# Patient Record
Sex: Female | Born: 1970 | Race: Black or African American | Hispanic: No | Marital: Single | Smoking: Never smoker
Health system: Southern US, Community
[De-identification: ages and names within clinical notes are randomized; demographics above are authoritative.]

## PROBLEM LIST (undated history)

## (undated) DIAGNOSIS — E7800 Pure hypercholesterolemia, unspecified: Secondary | ICD-10-CM

## (undated) DIAGNOSIS — I10 Essential (primary) hypertension: Secondary | ICD-10-CM

## (undated) HISTORY — PX: OTHER SURGICAL HISTORY: SHX169

## (undated) HISTORY — PX: ABDOMINAL HYSTERECTOMY: SHX81

---

## 2010-06-21 ENCOUNTER — Inpatient Hospital Stay (HOSPITAL_COMMUNITY): Admission: RE | Admit: 2010-06-21 | Discharge: 2010-06-23 | Payer: Self-pay | Admitting: Obstetrics

## 2010-06-21 ENCOUNTER — Encounter (INDEPENDENT_AMBULATORY_CARE_PROVIDER_SITE_OTHER): Payer: Self-pay | Admitting: Obstetrics

## 2011-03-16 LAB — BASIC METABOLIC PANEL
BUN: 15 mg/dL (ref 6–23)
CO2: 26 mEq/L (ref 19–32)
Calcium: 9.8 mg/dL (ref 8.4–10.5)
Creatinine, Ser: 0.76 mg/dL (ref 0.4–1.2)
GFR calc Af Amer: >60 mL/min (ref >60)

## 2011-03-16 LAB — ABO/RH: ABO/RH(D): O POS

## 2011-03-16 LAB — CBC
HCT: 36.9 % (ref 36.0–46.0)
MCH: 31.3 pg (ref 26.0–34.0)
MCHC: 33.9 g/dL (ref 30.0–36.0)
Platelets: 185 10*3/uL (ref 150–400)
Platelets: 237 10*3/uL (ref 150–400)
RBC: 4.81 MIL/uL (ref 3.87–5.11)
RDW: 13 % (ref 11.5–15.5)
RDW: 13.5 % (ref 11.5–15.5)
WBC: 10.4 10*3/uL (ref 4.0–10.5)

## 2011-03-16 LAB — SURGICAL PCR SCREEN: MRSA, PCR: NEGATIVE

## 2011-03-16 LAB — TYPE AND SCREEN

## 2011-03-16 LAB — PREGNANCY, URINE: Preg Test, Ur: NEGATIVE

## 2013-02-23 ENCOUNTER — Telehealth: Payer: Self-pay | Admitting: Radiology

## 2013-02-23 ENCOUNTER — Ambulatory Visit (INDEPENDENT_AMBULATORY_CARE_PROVIDER_SITE_OTHER): Payer: Managed Care, Other (non HMO) | Admitting: Family Medicine

## 2013-02-23 ENCOUNTER — Emergency Department (HOSPITAL_COMMUNITY)
Admission: RE | Admit: 2013-02-23 | Discharge: 2013-02-23 | Disposition: A | Discharge status: Home / Self Care | Payer: Managed Care, Other (non HMO) | Source: Ambulatory Visit | Attending: Family Medicine | Admitting: Family Medicine

## 2013-02-23 ENCOUNTER — Emergency Department (HOSPITAL_COMMUNITY): Admission: EM | Admit: 2013-02-23 | Payer: Managed Care, Other (non HMO) | Source: Home / Self Care

## 2013-02-23 DIAGNOSIS — L0201 Cutaneous abscess of face: Secondary | ICD-10-CM | POA: Insufficient documentation

## 2013-02-23 DIAGNOSIS — L03211 Cellulitis of face: Secondary | ICD-10-CM | POA: Insufficient documentation

## 2013-02-23 DIAGNOSIS — H00039 Abscess of eyelid unspecified eye, unspecified eyelid: Secondary | ICD-10-CM

## 2013-02-23 LAB — POCT CBC
HCT, POC: 40.6 % (ref 37.7–47.9)
Hemoglobin: 13.2 g/dL (ref 12.2–16.2)
Lymph, poc: 2.3 (ref 0.6–3.4)
MCH, POC: 29.9 pg (ref 27–31.2)
MCHC: 32.5 g/dL (ref 31.8–35.4)
MCV: 91.8 fL (ref 80–97)
POC Granulocyte: 3.4 (ref 2–6.9)
POC LYMPH PERCENT: 37.9 %L (ref 10–50)
RDW, POC: 13.1 %
WBC: 6.1 10*3/uL (ref 4.6–10.2)

## 2013-02-23 MED ORDER — TRAMADOL HCL 50 MG PO TABS: 50.0000 mg | ORAL_TABLET | Freq: Three times a day (TID) | ORAL | Status: DC | PRN

## 2013-02-23 MED ORDER — CEFDINIR 300 MG PO CAPS: 300.0000 mg | ORAL_CAPSULE | Freq: Two times a day (BID) | ORAL | Status: DC

## 2013-02-23 MED ORDER — IOHEXOL 300 MG/ML  SOLN
100.0000 mL | Freq: Once | INTRAMUSCULAR | Status: AC | PRN
  Administered 2013-02-23: 100 mL via INTRAVENOUS

## 2013-02-23 NOTE — Telephone Encounter (Signed)
Patients Ct scan RS to tomorrow. Please advise on pain meds for patient.

## 2013-02-23 NOTE — Progress Notes (Addendum)
[facility] [address], [place] [address] [phone]  Date:  02/23/2013   Name:  Sara Salinas   DOB:  1971-06-05   MRN:  [id number]  PCP:  No primary provider on file.    Chief Complaint: Sinus Problem and Blurred Vision   History of Present Illness:  Sara Salinas is a 42 y.o. very pleasant female patient who presents with the following:  Today is Wednesday.  Late Monday night she noted pain in her sinuses- yesterday she noted that the area around her right eye was swollen and tender. Her right eye vision is slightly blurry. She does not have any FB sensation or sensation that her cornea is affected. Her face continues to be swollen and tender today, and this seems different from a typical attack of sinusitis to her.   There is no known trauma No fever or chills No cough No GI symptoms No ST The right eye has been watering- just clear fluid running from her eye, no matting or crusting No photophobia.   No corrective lenses used.  She is generally healthy Hysterectomy- no change of pregnancy   Of note her visual acuity was actually better in her right eye on testing today.  There is no problem list on file for this patient.   History reviewed. No pertinent past medical history.  Past Surgical History  Procedure Laterality Date  . Abdominal hysterectomy      History  Substance Use Topics  . Smoking status: Never Smoker   . Smokeless tobacco: Not on file  . Alcohol Use: Yes    Family History  Problem Relation Age of Onset  . Hyperlipidemia Mother   . Heart disease Maternal Grandmother   . Diabetes Maternal Grandmother   . Hypertension Maternal Grandmother     No Known Allergies  Medication list has been reviewed and updated.  No current outpatient prescriptions on file prior to visit.   No current facility-administered medications on file prior to visit.    Review of Systems:  As per HPI- otherwise  negative.   Physical Examination: Filed Vitals:   02/23/13 1419  BP: 123/83  Pulse: 76  Temp: 97.9 F (36.6 C)  Resp: 18   Filed Vitals:   02/23/13 1419  Height: 5\' 4"  (1.626 m)  Weight: 159 lb (72.122 kg)   Body mass index is 27.28 kg/(m^2). Ideal Body Weight: Weight in (lb) to have BMI = 25: 145.3  GEN: WDWN, NAD, Non-toxic, A & O x 3 HEENT: Atraumatic, Normocephalic. Neck supple. No masses, No LAD.  Bilateral TM wnl, oropharynx normal.  PEERL,EOMI.  Nasal cavity normal She has slight edema and darkening/ redness under the right eye. This area is tender to the touch. She has minimal discomfort with use of extraorbial muscles Negative fluroescin stain Normal fundoscopic exam, no conjunctival injection, no pain with gentle pressure on globe Ears and Nose: No external deformity. CV: RRR, No M/G/R. No JVD. No thrill. No extra heart sounds. PULM: CTA B, no wheezes, crackles, rhonchi. No retractions. No resp. distress. No accessory muscle use. ABD: S, NT, ND EXTR: No c/c/e NEURO Normal gait.  PSYCH: Normally interactive. Conversant. Not depressed or anxious appearing.  Calm demeanor.   Results for orders placed in visit on 02/23/13  POCT CBC      Result Value Range   WBC 6.1  4.6 - 10.2 K/uL   Lymph, poc 2.3  0.6 - 3.4   POC LYMPH PERCENT  37.9  10 - 50 %L   MID (cbc) 0.3  0 - 0.9   POC MID % 5.7  0 - 12 %M   POC Granulocyte 3.4  2 - 6.9   Granulocyte percent 56.4  37 - 80 %G   RBC 4.42  4.04 - 5.48 M/uL   Hemoglobin 13.2  12.2 - 16.2 g/dL   HCT, POC 14.7  82.9 - 47.9 %   MCV 91.8  80 - 97 fL   MCH, POC 29.9  27 - 31.2 pg   MCHC 32.5  31.8 - 35.4 g/dL   RDW, POC 56.2     Platelet Count, POC 270  142 - 424 K/uL   MPV 9.7  0 - 99.8 fL    Assessment and Plan: Periorbital cellulitis, right - Plan: POCT CBC, Culture, blood (single), CT Maxillofacial W/Cm, CANCELED: CT Maxillofacial W/Cm  Concern for possible peri- orbital vs orbital cellulitis.  Will send for a CT now  to further evaluate and ensure she does not have orbital cellulitis  Tiki Tucciarone, MD  CT MAXILLOFACIAL WITH CONTRAST  Technique: Multidetector CT imaging of the maxillofacial structures was performed with intravenous contrast. Multiplanar CT image reconstructions were also generated.  Contrast: 80 ml Omnipaque 300 IV.  Comparison: None.  Findings: Slight preorbital soft tissue swelling bilaterally, right slightly greater than left. No focal fluid collections to suggest abscess. Globes and intraorbital soft tissues are unremarkable.  Rounded soft tissue in the maxillary sinuses, likely mucous retention cysts or polyps. No air fluid levels in the paranasal sinuses. Mastoids are clear.  IMPRESSION: Slight preorbital soft tissue swelling. No focal fluid collection to suggest abscess.  Discussed with Dr.Dover- no sign of orbital cellulitis.  Question mild peri- orbital cellulitis.  Will treat her with omnicef and ultram prn for her pain.  Will send these to her drug store.  Called and discussed with her.  Will also have an OOW note written for her for the rest of the week.  She will let me know if not better in the next one or two days

## 2013-02-23 NOTE — Telephone Encounter (Signed)
[id number] case # is not approved with [employer] [id number] is CPT code for the study. They did speak to DR Copland auth good for 45days [id number]

## 2013-02-23 NOTE — Patient Instructions (Addendum)
You can go now for your CT scan auth # from [employer] is  [id number]  Once your CT is done we will talk on the phone and decide the next step- you will most likely need antibiotics

## 2013-02-23 NOTE — Telephone Encounter (Signed)
CT scan can be done today at [facility] hospital now, maxillofacial. Patient advised.

## 2013-02-24 ENCOUNTER — Telehealth: Payer: Self-pay | Admitting: Family Medicine

## 2013-02-24 ENCOUNTER — Other Ambulatory Visit: Payer: Self-pay | Admitting: Family Medicine

## 2013-02-24 NOTE — Telephone Encounter (Signed)
Called and LMOM- wanted to check on her.  If she feels that she is at all worse please go back to [facility] for a recheck.  Otherwise can look at her tomorrow to ensure she is improving.  Will try her again later

## 2013-02-24 NOTE — Telephone Encounter (Signed)
Message copied by Pearline Cables on Thu Feb 24, 2013  4:07 PM ------      Message from: Abbe Amsterdam C      Created: Wed Feb 23, 2013  9:47 PM       Work note, check on her ------

## 2013-02-24 NOTE — Telephone Encounter (Signed)
Message copied by Pearline Cables on Thu Feb 24, 2013  1:39 PM ------      Message from: Abbe Amsterdam C      Created: Wed Feb 23, 2013  9:47 PM       Work note, check on her ------

## 2013-02-24 NOTE — Telephone Encounter (Signed)
Spoke with her- she feels a bit better, certainly not worse.  Advised her that the most conservative thing would be to check her back in clinic today to gauge her progress.  However, as she just started her medication this morning she is not sure if this would be helpful.  If she prefers, she may come by clinic to see me tomorrow and I will be glad to recheck her to ensure she is improving.  Gave her my hours for tomorrow and advised her to call or RTC if she starts getting worse or has any other concerns

## 2013-02-24 NOTE — Telephone Encounter (Signed)
Message copied by Pearline Cables on Thu Feb 24, 2013  2:04 PM ------      Message from: Abbe Amsterdam C      Created: Wed Feb 23, 2013  9:47 PM       Work note, check on her ------

## 2013-02-25 ENCOUNTER — Telehealth: Payer: Self-pay | Admitting: Family Medicine

## 2013-02-25 ENCOUNTER — Ambulatory Visit: Payer: Managed Care, Other (non HMO)

## 2013-02-25 NOTE — Telephone Encounter (Signed)
Dr Patsy Lager wanted to see how patient was she was suppose to follow up with her today(02/25/13) but didn't [facility] for patient to give [place] a call to see how she is doing

## 2013-02-27 ENCOUNTER — Telehealth: Payer: Self-pay | Admitting: Family Medicine

## 2013-02-27 NOTE — Telephone Encounter (Signed)
Called and LMOM- please let us know if she is not better, may come in or call me if needed.  Otherwise I will assume that she is now doing doing better

## 2013-03-01 ENCOUNTER — Encounter: Payer: Self-pay | Admitting: Family Medicine

## 2013-06-01 ENCOUNTER — Emergency Department (HOSPITAL_COMMUNITY): Payer: Managed Care, Other (non HMO)

## 2013-06-01 ENCOUNTER — Encounter (HOSPITAL_COMMUNITY): Payer: Self-pay | Admitting: *Deleted

## 2013-06-01 ENCOUNTER — Emergency Department (HOSPITAL_COMMUNITY)
Admission: EM | Admit: 2013-06-01 | Discharge: 2013-06-01 | Disposition: A | Discharge status: Home / Self Care | Payer: Managed Care, Other (non HMO) | Attending: Emergency Medicine | Admitting: Emergency Medicine

## 2013-06-01 DIAGNOSIS — N83209 Unspecified ovarian cyst, unspecified side: Secondary | ICD-10-CM

## 2013-06-01 LAB — CBC WITH DIFFERENTIAL/PLATELET
Basophils Absolute: 0 10*3/uL (ref 0.0–0.1)
Basophils Relative: 0 % (ref 0–1)
Eosinophils Absolute: 0 10*3/uL (ref 0.0–0.7)
Eosinophils Relative: 0 % (ref 0–5)
HCT: 39.4 % (ref 36.0–46.0)
Hemoglobin: 14.5 g/dL (ref 12.0–15.0)
Lymphocytes Relative: 15 % (ref 12–46)
Lymphs Abs: 1.2 10*3/uL (ref 0.7–4.0)
MCH: 31.2 pg (ref 26.0–34.0)
MCHC: 36.8 g/dL — ABNORMAL HIGH (ref 30.0–36.0)
MCV: 84.7 fL (ref 78.0–100.0)
Monocytes Absolute: 0.3 10*3/uL (ref 0.1–1.0)
Monocytes Relative: 3 % (ref 3–12)
Neutro Abs: 7 10*3/uL (ref 1.7–7.7)
Neutrophils Relative %: 82 % — ABNORMAL HIGH (ref 43–77)
Platelets: 238 10*3/uL (ref 150–400)
RBC: 4.65 MIL/uL (ref 3.87–5.11)
RDW: 11.9 % (ref 11.5–15.5)
WBC: 8.5 10*3/uL (ref 4.0–10.5)

## 2013-06-01 LAB — COMPREHENSIVE METABOLIC PANEL
ALT: 13 U/L (ref 0–35)
AST: 17 U/L (ref 0–37)
Albumin: 3.9 g/dL (ref 3.5–5.2)
Alkaline Phosphatase: 66 U/L (ref 39–117)
BUN: 11 mg/dL (ref 6–23)
CO2: 24 mEq/L (ref 19–32)
Calcium: 9.8 mg/dL (ref 8.4–10.5)
Chloride: 98 mEq/L (ref 96–112)
Creatinine, Ser: 0.66 mg/dL (ref 0.50–1.10)
GFR calc Af Amer: >90 mL/min (ref >90)
GFR calc non Af Amer: >90 mL/min (ref >90)
Glucose, Bld: 119 mg/dL — ABNORMAL HIGH (ref 70–99)
Potassium: 3.8 mEq/L (ref 3.5–5.1)
Sodium: 133 mEq/L — ABNORMAL LOW (ref 135–145)
Total Bilirubin: 0.8 mg/dL (ref 0.3–1.2)
Total Protein: 8 g/dL (ref 6.0–8.3)

## 2013-06-01 LAB — URINALYSIS, ROUTINE W REFLEX MICROSCOPIC
Bilirubin Urine: NEGATIVE
Glucose, UA: NEGATIVE mg/dL
Hgb urine dipstick: NEGATIVE
Ketones, ur: >80 mg/dL — AB
Leukocytes, UA: NEGATIVE
Nitrite: NEGATIVE
Protein, ur: NEGATIVE mg/dL
Specific Gravity, Urine: 1.027 (ref 1.005–1.030)
Urobilinogen, UA: 1 mg/dL (ref 0.0–1.0)
pH: 7 (ref 5.0–8.0)

## 2013-06-01 MED ORDER — KETOROLAC TROMETHAMINE 30 MG/ML IJ SOLN
15.0000 mg | Freq: Once | INTRAMUSCULAR | Status: AC
  Administered 2013-06-01: 15 mg via INTRAVENOUS
  Filled 2013-06-01: qty 1

## 2013-06-01 MED ORDER — SODIUM CHLORIDE 0.9 % IV BOLUS (SEPSIS)
1000.0000 mL | Freq: Once | INTRAVENOUS | Status: AC
  Administered 2013-06-01: 1000 mL via INTRAVENOUS

## 2013-06-01 MED ORDER — HYDROCODONE-ACETAMINOPHEN 5-325 MG PO TABS: 1.0000 | ORAL_TABLET | Freq: Four times a day (QID) | ORAL | Status: DC | PRN

## 2013-06-01 MED ORDER — ONDANSETRON 8 MG PO TBDP
8.0000 mg | ORAL_TABLET | Freq: Once | ORAL | Status: AC
  Administered 2013-06-01: 8 mg via ORAL
  Filled 2013-06-01: qty 1

## 2013-06-01 MED ORDER — IOHEXOL 300 MG/ML  SOLN: 50.0000 mL | Freq: Once | INTRAMUSCULAR | Status: DC | PRN

## 2013-06-01 MED ORDER — ONDANSETRON HCL 4 MG/2ML IJ SOLN
4.0000 mg | Freq: Once | INTRAMUSCULAR | Status: AC
  Administered 2013-06-01: 4 mg via INTRAVENOUS
  Filled 2013-06-01: qty 2

## 2013-06-01 MED ORDER — HYDROMORPHONE HCL PF 1 MG/ML IJ SOLN
1.0000 mg | Freq: Once | INTRAMUSCULAR | Status: AC
  Administered 2013-06-01: 1 mg via INTRAVENOUS
  Filled 2013-06-01: qty 1

## 2013-06-01 NOTE — ED Notes (Signed)
Pt alert x4 v/s d/c instructions given teach back teaching done. Will monitor.

## 2013-06-01 NOTE — ED Notes (Signed)
Pt in c/o LLQ abd pain since 6pm, states pain came on suddenly, worsened over night, also n/v

## 2013-06-01 NOTE — ED Provider Notes (Signed)
History     CSN: [id number]  Arrival date & time 06/01/13  0228   First MD Initiated Contact with Patient 06/01/13 [phone]      Chief Complaint  Patient presents with  . Abdominal Pain    (Consider location/radiation/quality/duration/timing/severity/associated sxs/prior treatment) HPI Comments: Patient states, that 6 PM last night.  She suddenly had pain.  That started in her left side, radiating to left lower quadrant, sharp, and stabbing, and its been waxing and waning in intensity since that time, associated with nausea and vomiting.  No diarrhea, has no previous history of any type of abdominal pain.  She does have a history of a complete abdominal hysterectomy.  Several years ago  Patient is a 42 y.o. female presenting with abdominal pain. The history is provided by the patient.  Abdominal Pain This is a new problem. The current episode started today. The problem occurs constantly. The problem has been waxing and waning. Associated symptoms include abdominal pain, nausea and vomiting. Pertinent negatives include no coughing, fever or rash. She has tried nothing for the symptoms. The treatment provided no relief.    History reviewed. No pertinent past medical history.  Past Surgical History  Procedure Laterality Date  . Abdominal hysterectomy      Family History  Problem Relation Age of Onset  . Hyperlipidemia Mother   . Heart disease Maternal Grandmother   . Diabetes Maternal Grandmother   . Hypertension Maternal Grandmother     History  Substance Use Topics  . Smoking status: Never Smoker   . Smokeless tobacco: Not on file  . Alcohol Use: Yes    OB History   Grav Para Term Preterm Abortions TAB SAB Ect Mult Living                  Review of Systems  Constitutional: Negative for fever.  Respiratory: Negative for cough.   Gastrointestinal: Positive for nausea, vomiting and abdominal pain.  Genitourinary: Negative for dysuria, frequency and hematuria.  Skin:  Negative for rash and wound.  All other systems reviewed and are negative.    Allergies  Review of patient's allergies indicates no known allergies.  Home Medications   Current Outpatient Rx  Name  Route  Sig  Dispense  Refill  . HYDROcodone-acetaminophen (NORCO/VICODIN) 5-325 MG per tablet   Oral   Take 1 tablet by mouth every 6 (six) hours as needed for pain.   12 tablet   0     BP 136/84  Pulse 97  Temp(Src) 98.3 F (36.8 C) (Oral)  Resp 20  SpO2 95%  Physical Exam  Nursing note and vitals reviewed. Constitutional: She appears well-developed and well-nourished.  HENT:  Head: Normocephalic.  Eyes: Pupils are equal, round, and reactive to light.  Neck: Normal range of motion.  Cardiovascular: Normal rate.   Pulmonary/Chest: Effort normal.  Abdominal: Soft. She exhibits no distension. There is no tenderness.  Musculoskeletal: Normal range of motion.  Neurological: She is alert.  Skin: Skin is warm and dry. No rash noted.    ED Course  Procedures (including critical care time)  Labs Reviewed  CBC WITH DIFFERENTIAL - Abnormal; Notable for the following:    MCHC 36.8 (*)    Neutrophils Relative % 82 (*)    All other components within normal limits  COMPREHENSIVE METABOLIC PANEL - Abnormal; Notable for the following:    Sodium 133 (*)    Glucose, Bld 119 (*)    All other components within normal limits  URINALYSIS, ROUTINE W REFLEX MICROSCOPIC   Ct Abdomen Pelvis Wo Contrast  06/01/2013   *RADIOLOGY REPORT*  Clinical Data: Left-sided abdominal pain.  Nausea, vomiting and diarrhea.  CT ABDOMEN AND PELVIS WITHOUT CONTRAST  Technique:  Multidetector CT imaging of the abdomen and pelvis was performed following the standard protocol without intravenous contrast.  Comparison: No priors.  Findings:  Lung Bases: Unremarkable.  Abdomen/Pelvis:  There are no abnormal calcifications within the collecting system of either kidney, along the course of either ureter, or within  the lumen of the urinary bladder to suggest urinary tract calculi at this time.  No hydroureteronephrosis or perinephric stranding to suggest urinary tract obstruction at this time.  The unenhanced appearance of the kidneys is unremarkable bilaterally.  The unenhanced appearance of the liver, gallbladder, pancreas, spleen and bilateral adrenal glands is unremarkable.  There are adnexal lesions bilaterally.  On the right, this measures 3.5 x 2.6 cm and is of low attenuation, while on the left this measures 4.0 x 3.6 cm and is of intermediate attenuation (approximately 25 HU); these are incompletely characterized, but presumably ovarian cysts. Status post hysterectomy.  Trace volume of free fluid the cul-de- sac.  No larger volume of ascites.  No pneumoperitoneum.  No pathologic distension of small bowel.  No definite pathologic lymphadenopathy identified within the abdomen or pelvis on today's noncontrast CT examination.  Musculoskeletal: There are no aggressive appearing lytic or blastic lesions noted in the visualized portions of the skeleton.  IMPRESSION: 1.  No definite acute findings in the abdomen or pelvis to account for the patient's symptoms. 2.  Bilateral adnexal lesions, as above, presumably ovarian cysts. The largest of these measures 4.0 x 3.6 cm in the left ovary.   Original Report Authenticated By: Trudie Reed, M.D.     1. Ovarian cyst       MDM  Exam, is consistent with a kidney stone.  Patient is awaiting CT scan CT scan, shows that patient has a left ovarian cyst         Arman Filter, NP 06/01/13 [id number]

## 2013-06-01 NOTE — ED Notes (Signed)
C/o n/v/d/ x 1 day. Unable to keep food down.

## 2013-06-02 NOTE — ED Provider Notes (Signed)
Medical screening examination/treatment/procedure(s) were performed by non-physician practitioner and as supervising physician I was immediately available for consultation/collaboration.  Keylin Podolsky, MD 06/02/13 0556 

## 2015-03-06 ENCOUNTER — Ambulatory Visit (INDEPENDENT_AMBULATORY_CARE_PROVIDER_SITE_OTHER): Payer: Managed Care, Other (non HMO) | Admitting: Family Medicine

## 2015-03-06 ENCOUNTER — Ambulatory Visit (HOSPITAL_COMMUNITY)
Admission: RE | Admit: 2015-03-06 | Discharge: 2015-03-06 | Disposition: A | Discharge status: Home / Self Care | Payer: Managed Care, Other (non HMO) | Source: Ambulatory Visit | Attending: Family Medicine | Admitting: Family Medicine

## 2015-03-06 ENCOUNTER — Telehealth: Payer: Self-pay | Admitting: *Deleted

## 2015-03-06 VITALS — BP 118/80 | HR 81 | Temp 97.9°F | Resp 18 | Ht 64.5 in | Wt 157.0 lb

## 2015-03-06 DIAGNOSIS — M79609 Pain in unspecified limb: Secondary | ICD-10-CM | POA: Diagnosis not present

## 2015-03-06 DIAGNOSIS — M79669 Pain in unspecified lower leg: Secondary | ICD-10-CM

## 2015-03-06 DIAGNOSIS — M79604 Pain in right leg: Secondary | ICD-10-CM | POA: Insufficient documentation

## 2015-03-06 DIAGNOSIS — M79605 Pain in left leg: Secondary | ICD-10-CM | POA: Insufficient documentation

## 2015-03-06 LAB — POCT CBC
GRANULOCYTE PERCENT: 52.5 % (ref 37–80)
HEMATOCRIT: 43.2 % (ref 37.7–47.9)
HEMOGLOBIN: 14.1 g/dL (ref 12.2–16.2)
Lymph, poc: 3 (ref 0.6–3.4)
MCH: 29.8 pg (ref 27–31.2)
MCHC: 32.7 g/dL (ref 31.8–35.4)
MCV: 91.1 fL (ref 80–97)
MID (CBC): 0.5 (ref 0–0.9)
MPV: 8.4 fL (ref 0–99.8)
PLATELET COUNT, POC: 150 10*3/uL (ref 142–424)
POC Granulocyte: 3.9 (ref 2–6.9)
POC LYMPH PERCENT: 41.1 %L (ref 10–50)
POC MID %: 6.4 %M (ref 0–12)
RBC: 4.75 M/uL (ref 4.04–5.48)
RDW, POC: 12.9 %
WBC: 7.4 10*3/uL (ref 4.6–10.2)

## 2015-03-06 LAB — POCT UA - MICROSCOPIC ONLY
BACTERIA, U MICROSCOPIC: NEGATIVE
CASTS, UR, LPF, POC: NEGATIVE
Crystals, Ur, HPF, POC: NEGATIVE
Yeast, UA: NEGATIVE

## 2015-03-06 LAB — POCT URINALYSIS DIPSTICK
Bilirubin, UA: NEGATIVE
Glucose, UA: NEGATIVE
Ketones, UA: NEGATIVE
Leukocytes, UA: NEGATIVE
Nitrite, UA: NEGATIVE
PROTEIN UA: 30
SPEC GRAV UA: 1.025
UROBILINOGEN UA: 1
pH, UA: 5.5

## 2015-03-06 LAB — D-DIMER, QUANTITATIVE (NOT AT ARMC): D DIMER QUANT: 1.37 ug{FEU}/mL — AB (ref 0.00–0.48)

## 2015-03-06 LAB — CK: Total CK: 145 U/L (ref 7–177)

## 2015-03-06 NOTE — Patient Instructions (Addendum)
If you do not hear from me by about 4:30 regarding your labs please call back [phone] and tell them I was to call you about the labs  If the labs are negative, then begin diclofenac one twice daily  If you develop leg swelling please return. Also return if symptoms change dramatically in any other fashion.  If you're not doing better in 3 or 4 days of the medication please return

## 2015-03-06 NOTE — Telephone Encounter (Signed)
Pt was called and advised that an 4pm [facility] do[facility].  Pt was told where to go and advised if she had any problems to call the office back for further assistance.

## 2015-03-06 NOTE — Telephone Encounter (Signed)
The patient may be reached at [phone]

## 2015-03-06 NOTE — Progress Notes (Signed)
Subjective: 44 year old lady who comes in here today with a four-day history of leg pains. The pain has gotten worse over that period of time. No GI or GU complaints. Her legs just are aching. She is sore in both legs. It seems symmetrical. It has been hurting all the way down from the thighs to the lower legs, with generalized tenderness of the thighs and calves. She has a little discomfort in the low abdomen. No fevers. No change in activity. She stands and walks a lot at work, but nothing is really change there. It even hurt in the nighttime.  Objective: Healthy-appearing young lady in no major distress. Abdomen is soft without masses. Mild suprapubic tenderness. Good femoral pulses. Straight leg raising test negative though is a little painful bilaterally at 90. Deep tendon reflexes symmetrical. No gross abnormalities of the legs. No bruisings. No swelling. Is not point tender in a given spot, but has tenderness of both the thighs and the calves when squeezed. The calves may be a little more tender medially than laterally, but are tender all over. Thighs do not have any significant medial thigh tenderness.  Assessment: Bilateral leg pain, etiology undetermined Thigh and calf tenderness  Plan: UA, CBC, sedimentation rate, CPK, stat d-dimer  Venous thrombosis seems entirely unlikely, but if the d-dimer is elevated then we'll need to do a ultrasound of her legs  Results for orders placed or performed in visit on 03/06/15  POCT UA - Microscopic Only  Result Value Ref Range   WBC, Ur, HPF, POC 0-4    RBC, urine, microscopic 2-4    Bacteria, U Microscopic neg    Mucus, UA small    Epithelial cells, urine per micros 1-3    Crystals, Ur, HPF, POC neg    Casts, Ur, LPF, POC neg    Yeast, UA neg   POCT urinalysis dipstick  Result Value Ref Range   Color, UA dark yellow    Clarity, UA hazy    Glucose, UA neg    Bilirubin, UA neg    Ketones, UA neg    Spec Grav, UA 1.025    Blood, UA small     pH, UA 5.5    Protein, UA 30    Urobilinogen, UA 1.0    Nitrite, UA neg    Leukocytes, UA Negative   POCT CBC  Result Value Ref Range   WBC 7.4 4.6 - 10.2 K/uL   Lymph, poc 3.0 0.6 - 3.4   POC LYMPH PERCENT 41.1 10 - 50 %L   MID (cbc) 0.5 0 - 0.9   POC MID % 6.4 0 - 12 %M   POC Granulocyte 3.9 2 - 6.9   Granulocyte percent 52.5 37 - 80 %G   RBC 4.75 4.04 - 5.48 M/uL   Hemoglobin 14.1 12.2 - 16.2 g/dL   HCT, POC 16.143.2 09.637.7 - 47.9 %   MCV 91.1 80 - 97 fL   MCH, POC 29.8 27 - 31.2 pg   MCHC 32.7 31.8 - 35.4 g/dL   RDW, POC 04.512.9 %   Platelet Count, POC 150 142 - 424 K/uL   MPV 8.4 0 - 99.8 fL     D-dimer is positive. Have ordered a Doppler ultrasound of her legs. They're trying to reach her.  Doppler ultrasound report has been now received. It was negative. We'll await the additional labs. Patient was informed by the staff.

## 2015-03-06 NOTE — Progress Notes (Signed)
  VASCULAR LAB PRELIMINARY  PRELIMINARY  PRELIMINARY  PRELIMINARY  Bilateral lower extremity venous duplex completed.    Preliminary report:  Bilateral:  No evidence of DVT, superficial thrombosis, or Baker's Cyst.   Romaine Maciolek, RVS 03/06/2015, 4:59 PM

## 2015-03-07 ENCOUNTER — Telehealth: Payer: Self-pay

## 2015-03-07 LAB — SEDIMENTATION RATE: Sed Rate: 11 mm/hr (ref 0–20)

## 2015-03-07 NOTE — Telephone Encounter (Signed)
Hopper - Pt had venous doppler yesterday.  She says you would call her back today after you went over everything to prescribe her something.  Says she is hurting and her work took her out for a week because of this.  Please call asap [phone]

## 2015-03-07 NOTE — Telephone Encounter (Signed)
Call patient: Probably should come back again for a recheck tomorrow.

## 2015-03-07 NOTE — Telephone Encounter (Signed)
Spoke with pt, advised to RTC. Pt understood. 

## 2015-03-08 ENCOUNTER — Telehealth: Payer: Self-pay

## 2015-03-08 ENCOUNTER — Ambulatory Visit (INDEPENDENT_AMBULATORY_CARE_PROVIDER_SITE_OTHER): Payer: Managed Care, Other (non HMO) | Admitting: Family Medicine

## 2015-03-08 VITALS — BP 122/88 | HR 73 | Temp 98.1°F | Resp 16 | Ht 64.5 in | Wt 157.0 lb

## 2015-03-08 DIAGNOSIS — M79606 Pain in leg, unspecified: Secondary | ICD-10-CM | POA: Diagnosis not present

## 2015-03-08 DIAGNOSIS — R1032 Left lower quadrant pain: Secondary | ICD-10-CM

## 2015-03-08 MED ORDER — CYCLOBENZAPRINE HCL 5 MG PO TABS: 5.0000 mg | ORAL_TABLET | Freq: Every day | ORAL | Status: DC

## 2015-03-08 MED ORDER — MELOXICAM 15 MG PO TABS: 15.0000 mg | ORAL_TABLET | Freq: Every day | ORAL | Status: DC

## 2015-03-08 NOTE — Telephone Encounter (Signed)
Pt called and stated pharm only got the meloxicam, not the flexeril Dr Alwyn RenHopper had planned to send in. I see in OV notes that plan did include flexeril. Notified Dr Hopper's asst that this was not sent and she will ask him when he comes out of pt room. Dr Alwyn RenHopper gave VO flexeril 5 mg, 1 tab Qhs #20, NRF. Sent in script. Notified pt.

## 2015-03-08 NOTE — Progress Notes (Signed)
Subjective: Patient is here for recheck per my request. She was here couple days ago with unusual pain and tightness in her legs. She had an elevated d-dimer, but Doppler studies were normal. She has been checked for muscle enzymes, sedimentation rate, CBC, etc. and all was negative. She continues to feel about the same with the tight sensation in her legs bilaterally. She has a little discomfort across the low abdomen. Her bowels act regularly. She does not have dysuria. She does not have menstrual cycles. She's had a hysterectomy. She works a job regularly which involves walking around a lot at [employer]. No injuries. No major emotional stresses. She lives up 3 flights of stairs, but that has not changed.  Objective: Good range of motion of back. No tenderness. Abdomen has normal bowel sounds. Soft. Mild tenderness in the left lower quadrant and suprapubic region. Extremities are mildly tender. No edema.  Assessment: Nonspecific pain of legs and lower abdomen  Plan: Meloxicam Cyclobenzaprine Return if symptoms persist. We would have to try and figure out what other avenues to pursue at that time. I think that this is one of those unexplained things will probably just pass with time.

## 2015-03-08 NOTE — Patient Instructions (Addendum)
Take meloxicam 15 mg one daily with a meal  For additional pain you can take acetaminophen (Tylenol) 1000 mg (500 mg 2) maximum of 3 times daily  Take cyclobenzaprine (Flexeril) 5 mg 1 at bedtime for muscle relaxant  Return at anytime if worse, and if not improved over the next couple weeks, back for one more recheck.  Recommend taking some MiraLAX to try and clean out your bowels a little bit since you have the left lower abdominal tenderness. Sometimes if some stool is back up on you it can cause pressure and pain.

## 2015-03-12 ENCOUNTER — Encounter: Payer: Self-pay | Admitting: *Deleted

## 2016-06-10 ENCOUNTER — Ambulatory Visit: Payer: Self-pay

## 2016-06-10 ENCOUNTER — Other Ambulatory Visit: Payer: Self-pay | Admitting: Occupational Medicine

## 2016-06-10 DIAGNOSIS — M25521 Pain in right elbow: Secondary | ICD-10-CM

## 2017-07-25 ENCOUNTER — Ambulatory Visit (INDEPENDENT_AMBULATORY_CARE_PROVIDER_SITE_OTHER): Payer: Managed Care, Other (non HMO) | Admitting: Osteopathic Medicine

## 2017-07-25 ENCOUNTER — Ambulatory Visit (INDEPENDENT_AMBULATORY_CARE_PROVIDER_SITE_OTHER): Payer: Managed Care, Other (non HMO)

## 2017-07-25 VITALS — BP 126/83 | HR 98 | Temp 98.7°F | Resp 17 | Ht 63.5 in | Wt 171.2 lb

## 2017-07-25 DIAGNOSIS — M545 Low back pain, unspecified: Secondary | ICD-10-CM

## 2017-07-25 MED ORDER — METHYLPREDNISOLONE 4 MG PO TBPK: ORAL_TABLET | ORAL | 0 refills | Status: DC

## 2017-07-25 MED ORDER — NAPROXEN 500 MG PO TABS: 500.0000 mg | ORAL_TABLET | Freq: Two times a day (BID) | ORAL | 1 refills | Status: AC

## 2017-07-25 MED ORDER — KETOROLAC TROMETHAMINE 60 MG/2ML IM SOLN
60.0000 mg | Freq: Once | INTRAMUSCULAR | Status: AC
Start: 2017-07-25 — End: 2017-07-25
  Administered 2017-07-25: 60 mg via INTRAMUSCULAR

## 2017-07-25 MED ORDER — METHOCARBAMOL 500 MG PO TABS: 500.0000 mg | ORAL_TABLET | Freq: Three times a day (TID) | ORAL | 1 refills | Status: DC

## 2017-07-25 NOTE — Patient Instructions (Addendum)
Plan: Home exercises Steroid taper - start today Muscle relaxer (Robaxin) can start today - may cause some sedation, so try in evening first Anti-inflammatory (Naproxen) can start this evening or tomorrow since we did the shot in the office today Continue medications daily for one week, and as needed after that  If exercises and medications aren't helping, I can send referral for formal physical therapy       IF you received an x-ray today, you will receive an invoice from [facility]. Please contact [facility] at [phone] with questions or concerns regarding your invoice.   IF you received labwork today, you will receive an invoice from [facility]. Please contact [facility] at [phone] with questions or concerns regarding your invoice.   Our billing staff will not be able to assist you with questions regarding bills from these companies.  You will be contacted with the lab results as soon as they are available. The fastest way to get your results is to activate your My Chart account. Instructions are located on the last page of this paperwork. If you have not heard from us regarding the results in 2 weeks, please contact this office.

## 2017-07-25 NOTE — Progress Notes (Signed)
HPI: Sara Salinas is a 46 y.o. female  who presents to [facility] today, 07/25/17,  for chief complaint of:  Chief Complaint  Patient presents with  . Back Pain    lower back pain. No injury or urinary sx's. Noticed after she bend over yesterday     Back pain  . Context: happened after bending forward yesterday  . Location: lower back worse on L, nonradiating . Quality: "just hurts" - there was no pop or shooting pain  . Severity: up to 7/10 . Duration: <24 hours  . Modifying factors: icy-hot and biofreeze not helpful, cyclobenzaprine 5mg  not helpful     Past medical, surgical, social and family history reviewed: There are no active problems to display for this patient.  Past Surgical History:  Procedure Laterality Date  . ABDOMINAL HYSTERECTOMY     Social History  Substance Use Topics  . Smoking status: Never Smoker  . Smokeless tobacco: Not on file  . Alcohol use Yes   Family History  Problem Relation Age of Onset  . Hyperlipidemia Mother   . Heart disease Maternal Grandmother   . Diabetes Maternal Grandmother   . Hypertension Maternal Grandmother      Current medication list and allergy/intolerance information reviewed:   Current Outpatient Prescriptions  Medication Sig Dispense Refill  . cyclobenzaprine (FLEXERIL) 5 MG tablet Take 1 tablet (5 mg total) by mouth at bedtime. (Patient not taking: Reported on 07/25/2017) 20 tablet 0  . HYDROcodone-acetaminophen (NORCO/VICODIN) 5-325 MG per tablet Take 1 tablet by mouth every 6 (six) hours as needed for pain. (Patient not taking: Reported on 07/25/2017) 12 tablet 0  . meloxicam (MOBIC) 15 MG tablet Take 1 tablet (15 mg total) by mouth daily. (Patient not taking: Reported on 07/25/2017) 20 tablet 0   No current facility-administered medications for this visit.    No Known Allergies    Review of Systems:  Constitutional:  No  fever, no chills, No recent illness,   Cardiac: No  chest  pain  Respiratory:  No  shortness of breath. No  Cough  Gastrointestinal: No  abdominal pain, No  nausea  Musculoskeletal: +new myalgia/arthralgia - LBP as per HPI   Genitourinary: No  abnormal genital discharge, no dysuria/frequency   Neurologic: No  weakness, No  dizziness   Exam:  BP 126/83   Pulse 98   Temp 98.7 F (37.1 C) (Oral)   Resp 17   Ht 5' 3.5" (1.613 m)   Wt 171 lb 4 oz (77.7 kg)   SpO2 97%   BMI 29.86 kg/m   Constitutional: VS see above. General Appearance: alert, well-developed, well-nourished, NAD  Neck: No masses, trachea midline.  Respiratory: Normal respiratory effort.   Gastrointestinal: Nontender, no masses.   Musculoskeletal: Gait normal. No clubbing/cyanosis of digits. Neg straight leg raise bilaterally, (+)tenderness L quadratus lumborum, no midline tenderness   Neurological: Normal balance/coordination. No tremor.  Skin: warm, dry, intact. No rash/ulcer.   Psychiatric: Normal judgment/insight. Normal mood and affect.     X-ray images reviewed personally with the patient. I see no malalignment, fracture, or other concern. Some loss of lumbar lordosis/thoracic kyphosis. I think likely due to spasm  Dg Lumbar Spine 2-3 Views  Result Date: 07/25/2017 CLINICAL DATA:  Acute low back pain without sciatica EXAM: LUMBAR SPINE - 2-3 VIEW COMPARISON:  06/01/2013 CT reconstructions FINDINGS: There is no evidence of lumbar spine fracture. Alignment is normal. Intervertebral disc spaces are maintained. IMPRESSION: Negative. Electronically Signed  By: Osvaldo ShipperM.  Shick M.D.   On: 07/25/2017 12:03     ASSESSMENT/PLAN:   Acute left-sided low back pain without sciatica - Plan: DG Lumbar Spine 2-3 Views, methocarbamol (ROBAXIN) 500 MG tablet, naproxen (NAPROSYN) 500 MG tablet, ketorolac (TORADOL) injection 60 mg, methylPREDNISolone (MEDROL DOSEPAK) 4 MG TBPK tablet    Patient Instructions   Plan: Home exercises Steroid taper - start today Muscle relaxer  (Robaxin) can start today - may cause some sedation, so try in evening first Anti-inflammatory (Naproxen) can start this evening or tomorrow since we did the shot in the office today Continue medications daily for one week, and as needed after that  If exercises and medications aren't helping, I can send referral for formal physical therapy       IF you received an x-ray today, you will receive an invoice from [facility]. Please contact [facility] at [phone] with questions or concerns regarding your invoice.   IF you received labwork today, you will receive an invoice from [facility]. Please contact [facility] at [phone] with questions or concerns regarding your invoice.   Our billing staff will not be able to assist you with questions regarding bills from these companies.  You will be contacted with the lab results as soon as they are available. The fastest way to get your results is to activate your My Chart account. Instructions are located on the last page of this paperwork. If you have not heard from us regarding the results in 2 weeks, please contact this office.         Visit summary with medication list and pertinent instructions was printed for patient to review. All questions at time of visit were answered - patient instructed to contact office with any additional concerns. ER/RTC precautions were reviewed with the patient. Follow-up plan: Return if symptoms worsen or fail to improve.

## 2018-01-02 IMAGING — DX DG LUMBAR SPINE 2-3V
3 series · 3 of 3 positions shown · non-contrast
Comparison: 06/01/2013 CT reconstructions

CLINICAL DATA: Acute low back pain without sciatica

EXAM:
LUMBAR SPINE - 2-3 VIEW

[l-spine ap]
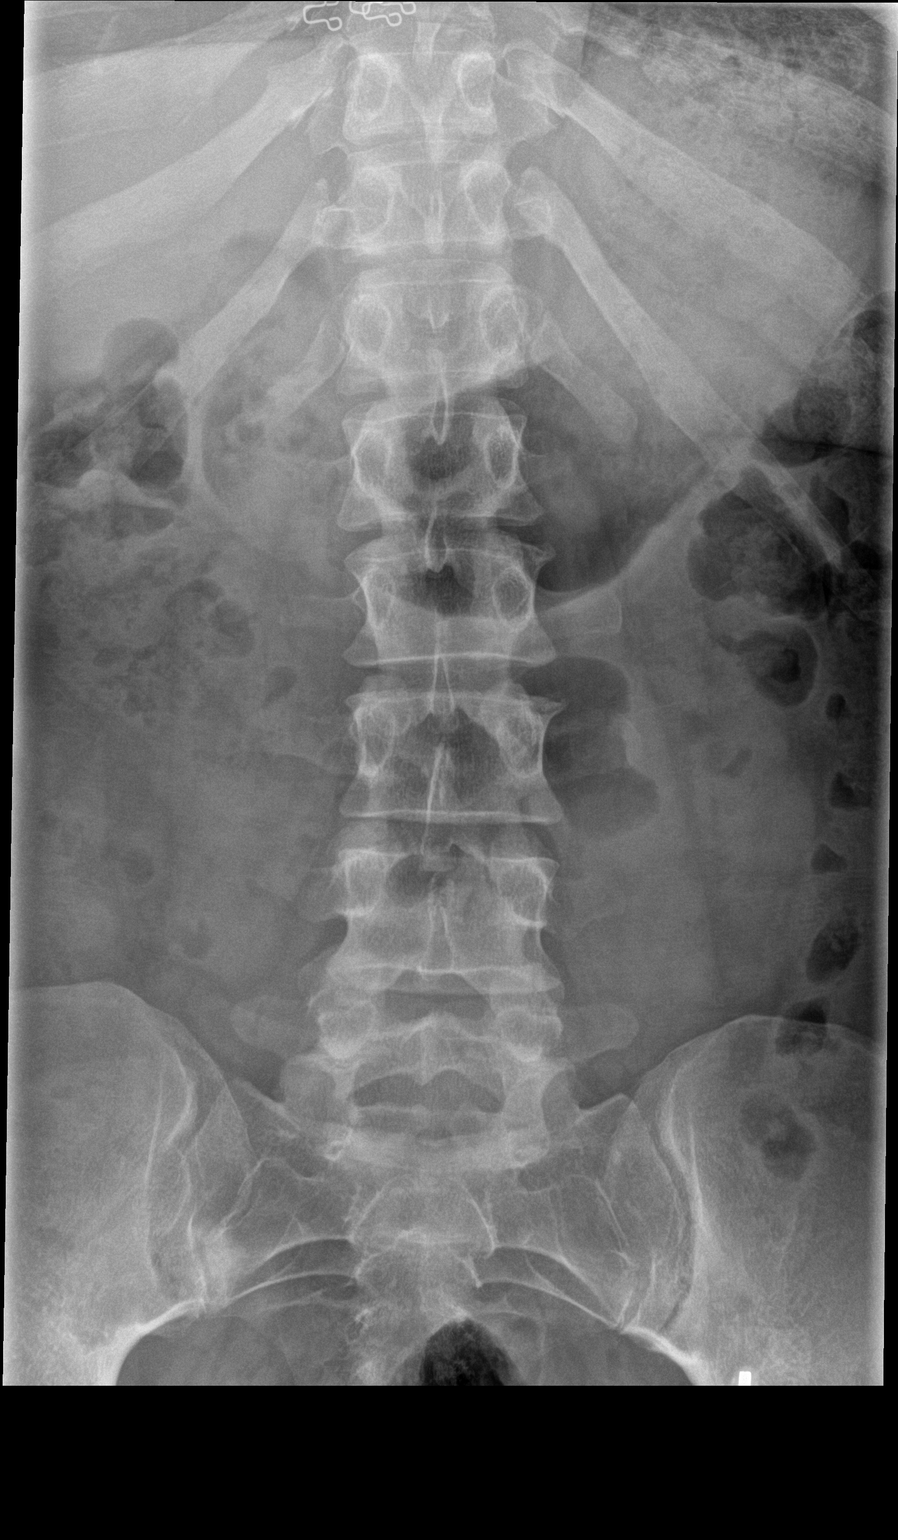

[l-spine lat]
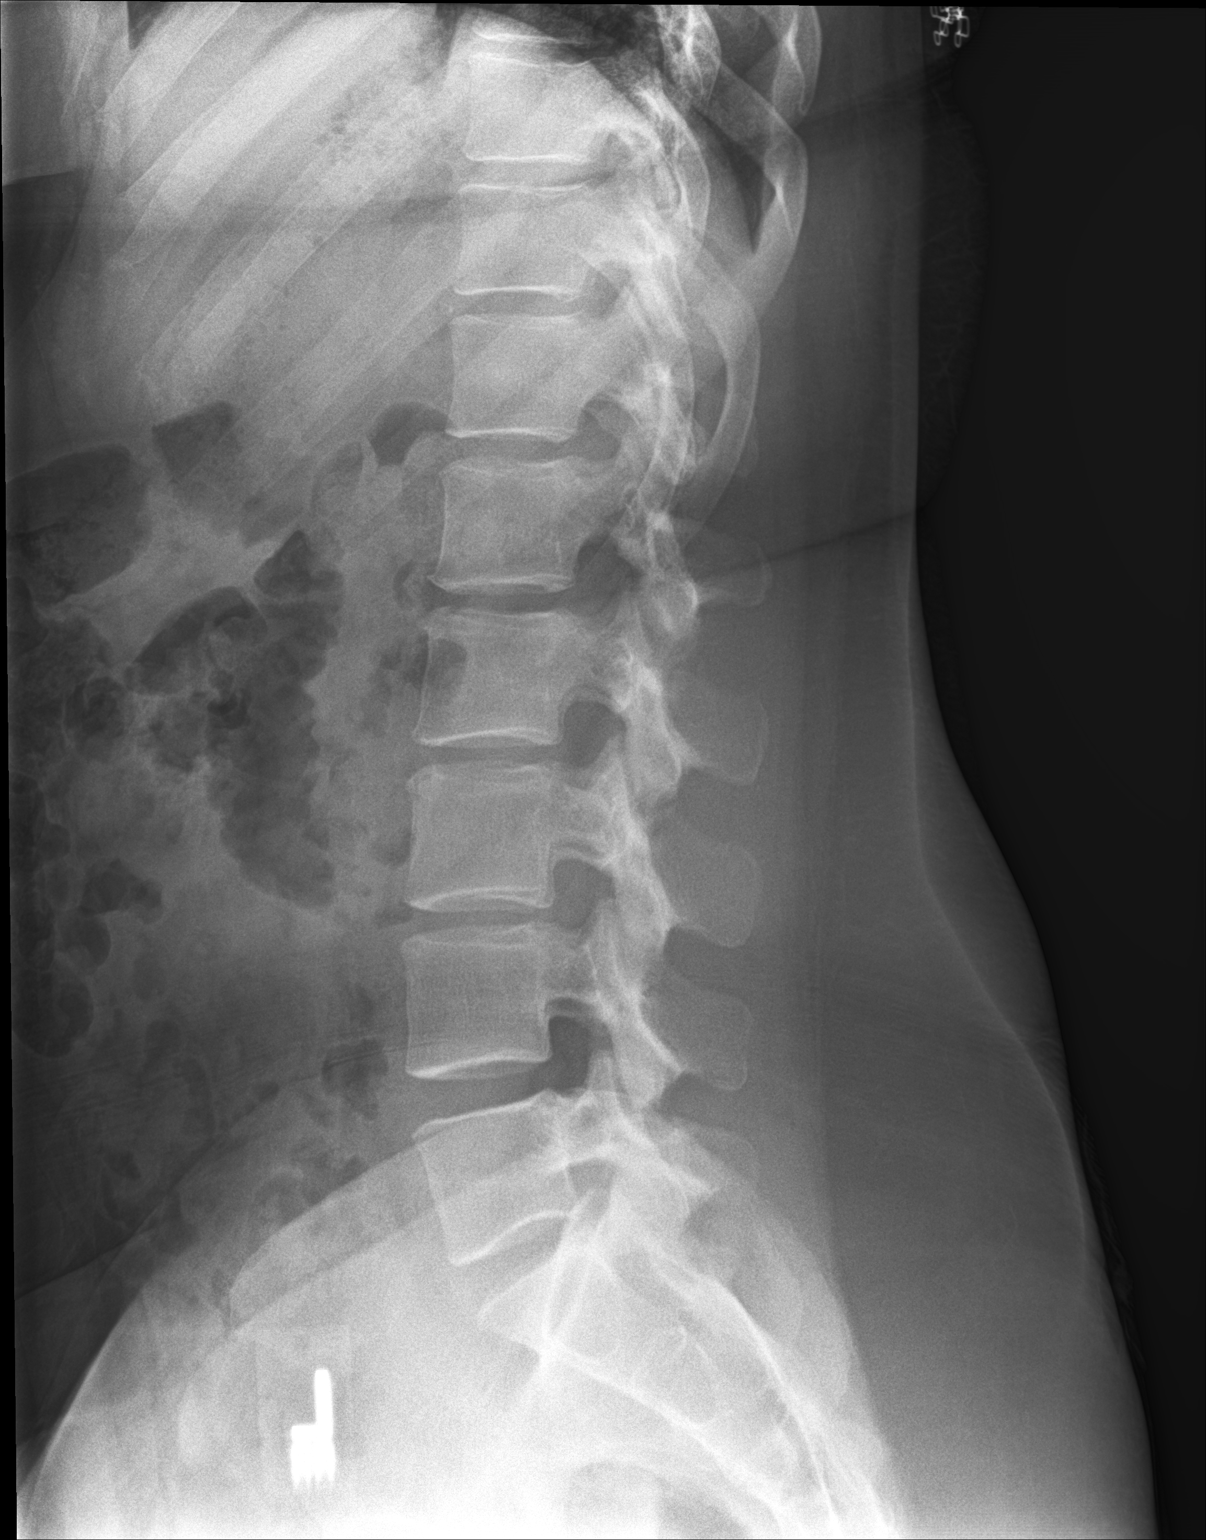

[l-spine l5-s1]
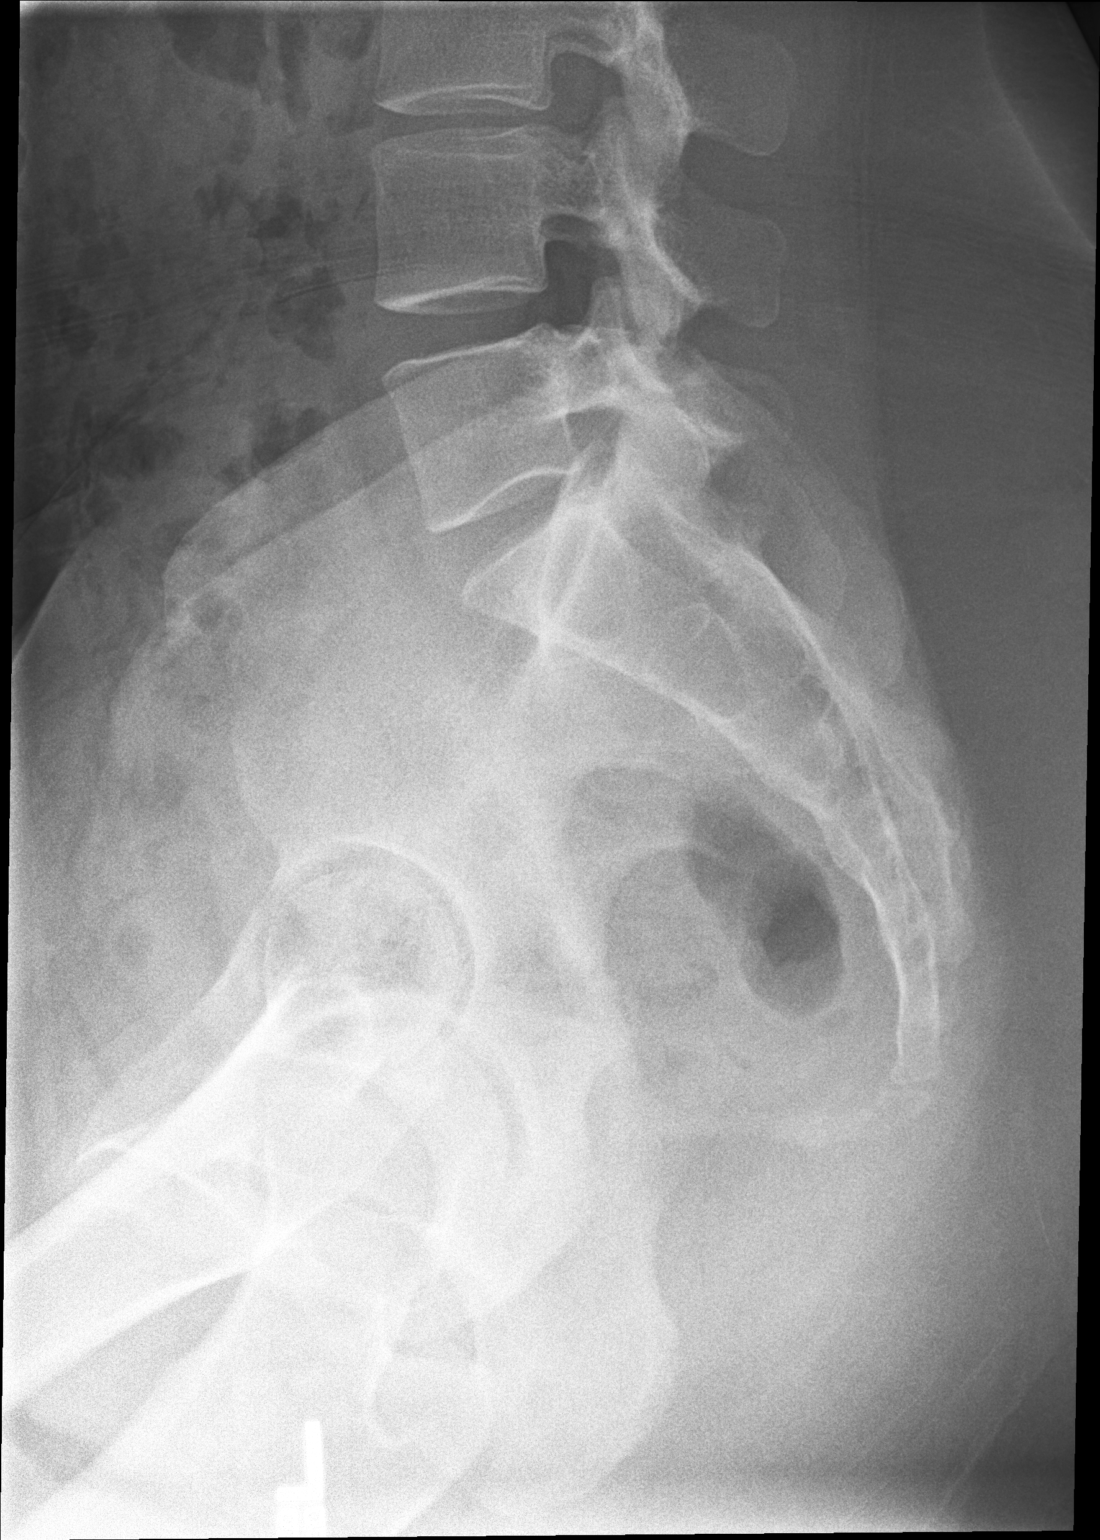

[3 of 3 positions shown; findings below may reference images not displayed]

FINDINGS: There is no evidence of lumbar spine fracture. Alignment is normal.
Intervertebral disc spaces are maintained.
IMPRESSION: Negative.

## 2018-10-19 ENCOUNTER — Other Ambulatory Visit: Payer: Self-pay

## 2018-10-19 ENCOUNTER — Ambulatory Visit (HOSPITAL_COMMUNITY)
Admission: EM | Admit: 2018-10-19 | Discharge: 2018-10-19 | Disposition: A | Discharge status: Home / Self Care | Payer: Managed Care, Other (non HMO) | Attending: Family Medicine | Admitting: Family Medicine

## 2018-10-19 ENCOUNTER — Encounter (HOSPITAL_COMMUNITY): Payer: Self-pay | Admitting: *Deleted

## 2018-10-19 DIAGNOSIS — L259 Unspecified contact dermatitis, unspecified cause: Secondary | ICD-10-CM | POA: Diagnosis not present

## 2018-10-19 MED ORDER — PREDNISONE 10 MG (21) PO TBPK: ORAL_TABLET | Freq: Every day | ORAL | 0 refills | Status: DC

## 2018-10-19 NOTE — ED Provider Notes (Signed)
[facility]   [id number] 10/19/18 Arrival Time: 1554  ASSESSMENT & PLAN:  1. Contact dermatitis, unspecified contact dermatitis type, unspecified trigger     Meds ordered this encounter  Medications  . predniSONE (STERAPRED UNI-PAK 21 TAB) 10 MG (21) TBPK tablet    Sig: Take by mouth daily. Take as directed.    Dispense:  21 tablet    Refill:  0   Benadryl if needed. Will follow up with PCP or here if worsening or failing to improve as anticipated. Reviewed expectations re: course of current medical issues. Questions answered. Outlined signs and symptoms indicating need for more acute intervention. Patient verbalized understanding. After Visit Summary given.   SUBJECTIVE:  Sara Salinas is a 47 y.o. female who presents with a skin complaint.   Location: back mostly; some on abdomen and bilateral thighs Onset: abrupt Duration: noticed upon waking this morning Associated pruritis? moderate Associated pain? none Progression: stable; question slight worsening Drainage? No  Known trigger? No  New soaps/lotions/topicals/detergents/environmental exposures? No Contacts with similar? No Recent travel? No  Other associated symptoms: none Therapies tried thus far: OTC Benadryl, one does, "not much help" Arthralgia or myalgia? none Recent illness? none Fever? none No specific aggravating or alleviating factors reported. No h/o similar rash.  ROS: As per HPI.  OBJECTIVE: Vitals:   10/19/18 1608  BP: 123/76  Pulse: 67  Resp: 18  Temp: 98.2 F (36.8 C)  TempSrc: Oral  SpO2: 97%    General appearance: alert; no distress Lungs: clear to auscultation bilaterally Heart: regular rate and rhythm Extremities: no edema Skin: warm and dry; scattered crops of oval, sharply delimited, slightly erythematous papules on back (R>L), some on abdomen and bilateral lower extremities; + dermatographism; some confluence; some linearity Psychological: alert and  cooperative; normal mood and affect  No Known Allergies   Social History   Socioeconomic History  . Marital status: Single    Spouse name: Not on file  . Number of children: Not on file  . Years of education: Not on file  . Highest education level: Not on file  Occupational History  . Not on file  Social Needs  . Financial resource strain: Not on file  . Food insecurity:    Worry: Not on file    Inability: Not on file  . Transportation needs:    Medical: Not on file    Non-medical: Not on file  Tobacco Use  . Smoking status: Never Smoker  . Smokeless tobacco: Never Used  Substance and Sexual Activity  . Alcohol use: Yes  . Drug use: No  . Sexual activity: Yes    Birth control/protection: None  Lifestyle  . Physical activity:    Days per week: Not on file    Minutes per session: Not on file  . Stress: Not on file  Relationships  . Social connections:    Talks on phone: Not on file    Gets together: Not on file    Attends religious service: Not on file    Active member of club or organization: Not on file    Attends meetings of clubs or organizations: Not on file    Relationship status: Not on file  . Intimate partner violence:    Fear of current or ex partner: Not on file    Emotionally abused: Not on file    Physically abused: Not on file    Forced sexual activity: Not on file  Other Topics Concern  . Not on  file  Social History Narrative  . Not on file   Family History  Problem Relation Age of Onset  . Hyperlipidemia Mother   . Heart disease Maternal Grandmother   . Diabetes Maternal Grandmother   . Hypertension Maternal Grandmother    Past Surgical History:  Procedure Laterality Date  . ABDOMINAL HYSTERECTOMY       Mardella Layman, MD 10/19/18 [phone]

## 2018-10-19 NOTE — ED Triage Notes (Signed)
States she woke up with rash on right side. States she got her flu shot last week and hasn't felt well since.

## 2019-10-21 ENCOUNTER — Other Ambulatory Visit: Payer: Self-pay

## 2019-10-21 DIAGNOSIS — Z20822 Contact with and (suspected) exposure to covid-19: Secondary | ICD-10-CM

## 2019-10-23 LAB — NOVEL CORONAVIRUS, NAA: SARS-CoV-2, NAA: NOT DETECTED

## 2020-02-27 ENCOUNTER — Ambulatory Visit: Payer: Managed Care, Other (non HMO) | Attending: Internal Medicine

## 2020-02-27 DIAGNOSIS — Z23 Encounter for immunization: Secondary | ICD-10-CM

## 2020-02-27 NOTE — Progress Notes (Signed)
   Covid-19 Vaccination Clinic  Name:  NATAJAH DERDERIAN    MRN: [id number] DOB: August 31, 1971  02/27/2020  Ms. Pinheiro was observed post Covid-19 immunization for 15 minutes without incidence. She was provided with Vaccine Information Sheet and instruction to access the V-Safe system.   Ms. Bin was instructed to call 911 with any severe reactions post vaccine: Marland Kitchen Difficulty breathing  . Swelling of your face and throat  . A fast heartbeat  . A bad rash all over your body  . Dizziness and weakness    Immunizations Administered    Name Date Dose VIS Date Route   Pfizer COVID-19 Vaccine 02/27/2020  9:49 AM 0.3 mL 12/09/2019 Intramuscular   Manufacturer: [employer], [employer]   Lot: [id number]   NDC: [address]-1

## 2020-03-21 ENCOUNTER — Ambulatory Visit: Payer: Managed Care, Other (non HMO) | Attending: Internal Medicine

## 2020-03-21 ENCOUNTER — Other Ambulatory Visit: Payer: Self-pay

## 2020-03-21 DIAGNOSIS — Z23 Encounter for immunization: Secondary | ICD-10-CM

## 2020-03-21 NOTE — Progress Notes (Signed)
   Covid-19 Vaccination Clinic  Name:  Sara Salinas    MRN: [id number] DOB: [birth year]  03/21/2020  [name] was observed post Covid-19 immunization for 30 minutes based on pre-vaccination screening without incident. She was provided with Vaccine Information Sheet and instruction to access the V-Safe system.   [name] was instructed to call 911 with any severe reactions post vaccine: Marland Kitchen Difficulty breathing  . Swelling of face and throat  . A fast heartbeat  . A bad rash all over body  . Dizziness and weakness   Immunizations Administered    Name Date Dose VIS Date Route   Pfizer COVID-19 Vaccine 03/21/2020  9:55 AM 0.3 mL 12/09/2019 Intramuscular   Manufacturer: [employer], [employer]   Lot: [id number]   NDC: [address]-2

## 2020-09-04 ENCOUNTER — Ambulatory Visit: Payer: Managed Care, Other (non HMO) | Admitting: Internal Medicine

## 2020-09-07 ENCOUNTER — Encounter: Payer: Self-pay | Admitting: Internal Medicine

## 2020-09-07 ENCOUNTER — Ambulatory Visit (INDEPENDENT_AMBULATORY_CARE_PROVIDER_SITE_OTHER): Payer: Managed Care, Other (non HMO) | Admitting: Internal Medicine

## 2020-09-07 ENCOUNTER — Other Ambulatory Visit: Payer: Self-pay

## 2020-09-07 VITALS — BP 140/100 | HR 87 | Temp 98.2°F | Ht 64.0 in | Wt 179.5 lb

## 2020-09-07 DIAGNOSIS — R030 Elevated blood-pressure reading, without diagnosis of hypertension: Secondary | ICD-10-CM | POA: Diagnosis not present

## 2020-09-07 DIAGNOSIS — Z1231 Encounter for screening mammogram for malignant neoplasm of breast: Secondary | ICD-10-CM

## 2020-09-07 DIAGNOSIS — Z0000 Encounter for general adult medical examination without abnormal findings: Secondary | ICD-10-CM

## 2020-09-07 DIAGNOSIS — Z23 Encounter for immunization: Secondary | ICD-10-CM | POA: Diagnosis not present

## 2020-09-07 DIAGNOSIS — Z1211 Encounter for screening for malignant neoplasm of colon: Secondary | ICD-10-CM

## 2020-09-07 NOTE — Patient Instructions (Signed)
-Nice seeing you today!!  -Make your lab appointment to return for lab work.  -Tetanus booster today. Remember your flu vaccine at work.  -Check blood pressure every day at home/work and bring your numbers in when you see me next.  -Mammogram and GI referral for colonoscopy have been requested.  -Schedule follow up in 8 weeks.   Preventive Care 3-48 Years Old, Female Preventive care refers to visits with your health care provider and lifestyle choices that can promote health and wellness. This includes:  A yearly physical exam. This may also be called an annual well check.  Regular dental visits and eye exams.  Immunizations.  Screening for certain conditions.  Healthy lifestyle choices, such as eating a healthy diet, getting regular exercise, not using drugs or products that contain nicotine and tobacco, and limiting alcohol use. What can I expect for my preventive care visit? Physical exam Your health care provider will check your:  Height and weight. This may be used to calculate body mass index (BMI), which tells if you are at a healthy weight.  Heart rate and blood pressure.  Skin for abnormal spots. Counseling Your health care provider may ask you questions about your:  Alcohol, tobacco, and drug use.  Emotional well-being.  Home and relationship well-being.  Sexual activity.  Eating habits.  Work and work Astronomer.  Method of birth control.  Menstrual cycle.  Pregnancy history. What immunizations do I need?  Influenza (flu) vaccine  This is recommended every year. Tetanus, diphtheria, and pertussis (Tdap) vaccine  You may need a Td booster every 10 years. Varicella (chickenpox) vaccine  You may need this if you have not been vaccinated. Zoster (shingles) vaccine  You may need this after age 36. Measles, mumps, and rubella (MMR) vaccine  You may need at least one dose of MMR if you were born in 1957 or later. You may also need a second  dose. Pneumococcal conjugate (PCV13) vaccine  You may need this if you have certain conditions and were not previously vaccinated. Pneumococcal polysaccharide (PPSV23) vaccine  You may need one or two doses if you smoke cigarettes or if you have certain conditions. Meningococcal conjugate (MenACWY) vaccine  You may need this if you have certain conditions. Hepatitis A vaccine  You may need this if you have certain conditions or if you travel or work in places where you may be exposed to hepatitis A. Hepatitis B vaccine  You may need this if you have certain conditions or if you travel or work in places where you may be exposed to hepatitis B. Haemophilus influenzae type b (Hib) vaccine  You may need this if you have certain conditions. Human papillomavirus (HPV) vaccine  If recommended by your health care provider, you may need three doses over 6 months. You may receive vaccines as individual doses or as more than one vaccine together in one shot (combination vaccines). Talk with your health care provider about the risks and benefits of combination vaccines. What tests do I need? Blood tests  Lipid and cholesterol levels. These may be checked every 5 years, or more frequently if you are over 76 years old.  Hepatitis C test.  Hepatitis B test. Screening  Lung cancer screening. You may have this screening every year starting at age 71 if you have a 30-pack-year history of smoking and currently smoke or have quit within the past 15 years.  Colorectal cancer screening. All adults should have this screening starting at age 65 and continuing  until age 94. Your health care provider may recommend screening at age 60 if you are at increased risk. You will have tests every 1-10 years, depending on your results and the type of screening test.  Diabetes screening. This is done by checking your blood sugar (glucose) after you have not eaten for a while (fasting). You may have this done every  1-3 years.  Mammogram. This may be done every 1-2 years. Talk with your health care provider about when you should start having regular mammograms. This may depend on whether you have a family history of breast cancer.  BRCA-related cancer screening. This may be done if you have a family history of breast, ovarian, tubal, or peritoneal cancers.  Pelvic exam and Pap test. This may be done every 3 years starting at age 61. Starting at age 66, this may be done every 5 years if you have a Pap test in combination with an HPV test. Other tests  Sexually transmitted disease (STD) testing.  Bone density scan. This is done to screen for osteoporosis. You may have this scan if you are at high risk for osteoporosis. Follow these instructions at home: Eating and drinking  Eat a diet that includes fresh fruits and vegetables, whole grains, lean protein, and low-fat dairy.  Take vitamin and mineral supplements as recommended by your health care provider.  Do not drink alcohol if: ? Your health care provider tells you not to drink. ? You are pregnant, may be pregnant, or are planning to become pregnant.  If you drink alcohol: ? Limit how much you have to 0-1 drink a day. ? Be aware of how much alcohol is in your drink. In the [place] one drink equals one 12 oz bottle of beer (355 mL), one 5 oz glass of wine (148 mL), or one 1 oz glass of hard liquor (44 mL). Lifestyle  Take daily care of your teeth and gums.  Stay active. Exercise for at least 30 minutes on 5 or more days each week.  Do not use any products that contain nicotine or tobacco, such as cigarettes, e-cigarettes, and chewing tobacco. If you need help quitting, ask your health care provider.  If you are sexually active, practice safe sex. Use a condom or other form of birth control (contraception) in order to prevent pregnancy and STIs (sexually transmitted infections).  If told by your health care provider, take low-dose aspirin daily  starting at age 36. What's next?  Visit your health care provider once a year for a well check visit.  Ask your health care provider how often you should have your eyes and teeth checked.  Stay up to date on all vaccines. This information is not intended to replace advice given to you by your health care provider. Make sure you discuss any questions you have with your health care provider. Document Revised: 08/26/2018 Document Reviewed: 08/26/2018 Elsevier Patient Education  2020 [employer].

## 2020-09-07 NOTE — Progress Notes (Signed)
New Patient Office Visit     This visit occurred during the SARS-CoV-2 public health emergency.  Safety protocols were in place, including screening questions prior to the visit, additional usage of staff PPE, and extensive cleaning of exam room while observing appropriate contact time as indicated for disinfecting solutions.    CC/Reason for Visit: Establish care, annual preventive exam Previous PCP: None Last Visit: Unknown  HPI: Sara Salinas is a 49 y.o. female who is coming in today for the above mentioned reasons.  She has no past medical history of significance.  She works in Mudlogger at [employer].  She lives with a partner, has no children, is originally from [place].  She has no known drug allergies.  Her past surgical history significant for a hysterectomy in 2002 and right rotator cuff repair in 2020.  She does not smoke, she drinks alcohol occasionally.  Family history significant for a maternal grandmother with hypertension and coronary artery disease.  She has routine eye care but no dental care.  She has had both of her Covid vaccines.  She is overdue for mammogram and colonoscopy.  She has no acute complaints today.   Past Medical/Surgical History: History reviewed. No pertinent past medical history.  Past Surgical History:  Procedure Laterality Date  . ABDOMINAL HYSTERECTOMY    . right rotator cuff     2020    Social History:  reports that she has never smoked. She has never used smokeless tobacco. She reports current alcohol use. She reports that she does not use drugs.  Allergies: No Known Allergies  Family History:  Family History  Problem Relation Age of Onset  . Hyperlipidemia Mother   . Hypertension Mother   . CAD Mother   . Heart disease Maternal Grandmother   . Diabetes Maternal Grandmother   . Hypertension Maternal Grandmother     No current outpatient medications on file.  Review of Systems:  Constitutional: Denies fever,  chills, diaphoresis, appetite change and fatigue.  HEENT: Denies photophobia, eye pain, redness, hearing loss, ear pain, congestion, sore throat, rhinorrhea, sneezing, mouth sores, trouble swallowing, neck pain, neck stiffness and tinnitus.   Respiratory: Denies SOB, DOE, cough, chest tightness,  and wheezing.   Cardiovascular: Denies chest pain, palpitations and leg swelling.  Gastrointestinal: Denies nausea, vomiting, abdominal pain, diarrhea, constipation, blood in stool and abdominal distention.  Genitourinary: Denies dysuria, urgency, frequency, hematuria, flank pain and difficulty urinating.  Endocrine: Denies: hot or cold intolerance, sweats, changes in hair or nails, polyuria, polydipsia. Musculoskeletal: Denies myalgias, back pain, joint swelling, arthralgias and gait problem.  Skin: Denies pallor, rash and wound.  Neurological: Denies dizziness, seizures, syncope, weakness, light-headedness, numbness and headaches.  Hematological: Denies adenopathy. Easy bruising, personal or family bleeding history  Psychiatric/Behavioral: Denies suicidal ideation, mood changes, confusion, nervousness, sleep disturbance and agitation    Physical Exam: Vitals:   09/07/20 0706  BP: (!) 140/100  Pulse: 87  Temp: 98.2 F (36.8 C)  TempSrc: Oral  SpO2: 99%  Weight: 179 lb 8 oz (81.4 kg)  Height: _0  (1.626 m)   Body mass index is 30.81 kg/m.  Constitutional: NAD, calm, comfortable Eyes: PERRL, lids and conjunctivae normal ENMT: Mucous membranes are moist Tympanic membrane is pearly white, no erythema or bulging. Neck: normal, supple, no masses, no thyromegaly Respiratory: clear to auscultation bilaterally, no wheezing, no crackles. Normal respiratory effort. No accessory muscle use.  Cardiovascular: Regular rate and rhythm, no murmurs / rubs / gallops. No extremity  edema. 2+ pedal pulses. No carotid bruits.  Abdomen: no tenderness, no masses palpated. No hepatosplenomegaly. Bowel sounds  positive.  Musculoskeletal: no clubbing / cyanosis. No joint deformity upper and lower extremities. Good ROM, no contractures. Normal muscle tone.  Skin: no rashes, lesions, ulcers. No induration Neurologic: CN 2-12 grossly intact. Sensation intact, DTR normal. Strength 5/5 in all 4.  Psychiatric: Normal judgment and insight. Alert and oriented x 3. Normal mood.    Impression and Plan:  Encounter for preventive health examination -Have advised routine eye and dental care. -Tdap update today, she will get her flu vaccine at work, she is fully vaccinated for Covid. -She will return for her labs as she is not fasting today. -Healthy lifestyle discussed in detail. -She is overdue for mammogram, I will order. -GI referral for screening colonoscopy. -She has had a total hysterectomy and no longer requires Pap smears.  Elevated BP without diagnosis of hypertension -She will do ambulatory measurements and return in 8 weeks for follow-up.    Patient Instructions  -Nice seeing you today!!  -Make your lab appointment to return for lab work.  -Tetanus booster today. Remember your flu vaccine at work.  -Check blood pressure every day at home/work and bring your numbers in when you see me next.  -Mammogram and GI referral for colonoscopy have been requested.  -Schedule follow up in 8 weeks.   Preventive Care 31-60 Years Old, Female Preventive care refers to visits with your health care provider and lifestyle choices that can promote health and wellness. This includes:  A yearly physical exam. This may also be called an annual well check.  Regular dental visits and eye exams.  Immunizations.  Screening for certain conditions.  Healthy lifestyle choices, such as eating a healthy diet, getting regular exercise, not using drugs or products that contain nicotine and tobacco, and limiting alcohol use. What can I expect for my preventive care visit? Physical exam Your health care  provider will check your:  Height and weight. This may be used to calculate body mass index (BMI), which tells if you are at a healthy weight.  Heart rate and blood pressure.  Skin for abnormal spots. Counseling Your health care provider may ask you questions about your:  Alcohol, tobacco, and drug use.  Emotional well-being.  Home and relationship well-being.  Sexual activity.  Eating habits.  Work and work Statistician.  Method of birth control.  Menstrual cycle.  Pregnancy history. What immunizations do I need?  Influenza (flu) vaccine  This is recommended every year. Tetanus, diphtheria, and pertussis (Tdap) vaccine  You may need a Td booster every 10 years. Varicella (chickenpox) vaccine  You may need this if you have not been vaccinated. Zoster (shingles) vaccine  You may need this after age 43. Measles, mumps, and rubella (MMR) vaccine  You may need at least one dose of MMR if you were born in 1957 or later. You may also need a second dose. Pneumococcal conjugate (PCV13) vaccine  You may need this if you have certain conditions and were not previously vaccinated. Pneumococcal polysaccharide (PPSV23) vaccine  You may need one or two doses if you smoke cigarettes or if you have certain conditions. Meningococcal conjugate (MenACWY) vaccine  You may need this if you have certain conditions. Hepatitis A vaccine  You may need this if you have certain conditions or if you travel or work in places where you may be exposed to hepatitis A. Hepatitis B vaccine  You may need this  if you have certain conditions or if you travel or work in places where you may be exposed to hepatitis B. Haemophilus influenzae type b (Hib) vaccine  You may need this if you have certain conditions. Human papillomavirus (HPV) vaccine  If recommended by your health care provider, you may need three doses over 6 months. You may receive vaccines as individual doses or as more than  one vaccine together in one shot (combination vaccines). Talk with your health care provider about the risks and benefits of combination vaccines. What tests do I need? Blood tests  Lipid and cholesterol levels. These may be checked every 5 years, or more frequently if you are over 31 years old.  Hepatitis C test.  Hepatitis B test. Screening  Lung cancer screening. You may have this screening every year starting at age 34 if you have a 30-pack-year history of smoking and currently smoke or have quit within the past 15 years.  Colorectal cancer screening. All adults should have this screening starting at age 31 and continuing until age 55. Your health care provider may recommend screening at age 75 if you are at increased risk. You will have tests every 1-10 years, depending on your results and the type of screening test.  Diabetes screening. This is done by checking your blood sugar (glucose) after you have not eaten for a while (fasting). You may have this done every 1-3 years.  Mammogram. This may be done every 1-2 years. Talk with your health care provider about when you should start having regular mammograms. This may depend on whether you have a family history of breast cancer.  BRCA-related cancer screening. This may be done if you have a family history of breast, ovarian, tubal, or peritoneal cancers.  Pelvic exam and Pap test. This may be done every 3 years starting at age 35. Starting at age 47, this may be done every 5 years if you have a Pap test in combination with an HPV test. Other tests  Sexually transmitted disease (STD) testing.  Bone density scan. This is done to screen for osteoporosis. You may have this scan if you are at high risk for osteoporosis. Follow these instructions at home: Eating and drinking  Eat a diet that includes fresh fruits and vegetables, whole grains, lean protein, and low-fat dairy.  Take vitamin and mineral supplements as recommended by your  health care provider.  Do not drink alcohol if: ? Your health care provider tells you not to drink. ? You are pregnant, may be pregnant, or are planning to become pregnant.  If you drink alcohol: ? Limit how much you have to 0-1 drink a day. ? Be aware of how much alcohol is in your drink. In the [place] one drink equals one 12 oz bottle of beer (355 mL), one 5 oz glass of wine (148 mL), or one 1 oz glass of hard liquor (44 mL). Lifestyle  Take daily care of your teeth and gums.  Stay active. Exercise for at least 30 minutes on 5 or more days each week.  Do not use any products that contain nicotine or tobacco, such as cigarettes, e-cigarettes, and chewing tobacco. If you need help quitting, ask your health care provider.  If you are sexually active, practice safe sex. Use a condom or other form of birth control (contraception) in order to prevent pregnancy and STIs (sexually transmitted infections).  If told by your health care provider, take low-dose aspirin daily starting at age 80. What's  next?  Visit your health care provider once a year for a well check visit.  Ask your health care provider how often you should have your eyes and teeth checked.  Stay up to date on all vaccines. This information is not intended to replace advice given to you by your health care provider. Make sure you discuss any questions you have with your health care provider. Document Revised: 08/26/2018 Document Reviewed: 08/26/2018 Elsevier Patient Education  2020 [place], MD [facility]

## 2020-09-07 NOTE — Addendum Note (Signed)
Addended by: Kern Reap B on: 09/07/2020 08:07 AM   Modules accepted: Orders

## 2020-09-08 LAB — COMPREHENSIVE METABOLIC PANEL
AG Ratio: 1.6 (calc) (ref 1.0–2.5)
ALT: 17 U/L (ref 6–29)
AST: 13 U/L (ref 10–35)
Albumin: 4.2 g/dL (ref 3.6–5.1)
Alkaline phosphatase (APISO): 85 U/L (ref 31–125)
BUN: 13 mg/dL (ref 7–25)
CO2: 28 mmol/L (ref 20–32)
Calcium: 9.2 mg/dL (ref 8.6–10.2)
Chloride: 105 mmol/L (ref 98–110)
Creat: 0.67 mg/dL (ref 0.50–1.10)
Globulin: 2.7 g/dL (calc) (ref 1.9–3.7)
Glucose, Bld: 104 mg/dL — ABNORMAL HIGH (ref 65–99)
Potassium: 4 mmol/L (ref 3.5–5.3)
Sodium: 140 mmol/L (ref 135–146)
Total Bilirubin: 0.7 mg/dL (ref 0.2–1.2)
Total Protein: 6.9 g/dL (ref 6.1–8.1)

## 2020-09-08 LAB — HEMOGLOBIN A1C
Hgb A1c MFr Bld: 6.1 % of total Hgb — ABNORMAL HIGH (ref <5.7)
Mean Plasma Glucose: 128 (calc)
eAG (mmol/L): 7.1 (calc)

## 2020-09-08 LAB — CBC WITH DIFFERENTIAL/PLATELET
Absolute Monocytes: 398 cells/uL (ref 200–950)
Basophils Absolute: 21 cells/uL (ref 0–200)
Basophils Relative: 0.3 %
Eosinophils Absolute: 192 cells/uL (ref 15–500)
Eosinophils Relative: 2.7 %
HCT: 40.1 % (ref 35.0–45.0)
Hemoglobin: 13 g/dL (ref 11.7–15.5)
Lymphs Abs: 2918 cells/uL (ref 850–3900)
MCH: 29.3 pg (ref 27.0–33.0)
MCHC: 32.4 g/dL (ref 32.0–36.0)
MCV: 90.3 fL (ref 80.0–100.0)
MPV: 10.9 fL (ref 7.5–12.5)
Monocytes Relative: 5.6 %
Neutro Abs: 3571 cells/uL (ref 1500–7800)
Neutrophils Relative %: 50.3 %
Platelets: 267 10*3/uL (ref 140–400)
RBC: 4.44 10*6/uL (ref 3.80–5.10)
RDW: 12.7 % (ref 11.0–15.0)
Total Lymphocyte: 41.1 %
WBC: 7.1 10*3/uL (ref 3.8–10.8)

## 2020-09-08 LAB — LIPID PANEL
Cholesterol: 218 mg/dL — ABNORMAL HIGH (ref <200)
HDL: 52 mg/dL (ref >=50)
LDL Cholesterol (Calc): 144 mg/dL (calc) — ABNORMAL HIGH
Non-HDL Cholesterol (Calc): 166 mg/dL (calc) — ABNORMAL HIGH (ref <130)
Total CHOL/HDL Ratio: 4.2 (calc) (ref <5.0)
Triglycerides: 104 mg/dL (ref <150)

## 2020-09-08 LAB — VITAMIN B12: Vitamin B-12: 538 pg/mL (ref 200–1100)

## 2020-09-08 LAB — TSH: TSH: 3.53 mIU/L

## 2020-09-08 LAB — VITAMIN D 25 HYDROXY (VIT D DEFICIENCY, FRACTURES): Vit D, 25-Hydroxy: 13 ng/mL — ABNORMAL LOW (ref 30–100)

## 2020-09-11 ENCOUNTER — Encounter: Payer: Self-pay | Admitting: Internal Medicine

## 2020-09-11 ENCOUNTER — Other Ambulatory Visit: Payer: Self-pay | Admitting: Internal Medicine

## 2020-09-11 DIAGNOSIS — E559 Vitamin D deficiency, unspecified: Secondary | ICD-10-CM

## 2020-09-11 DIAGNOSIS — R7302 Impaired glucose tolerance (oral): Secondary | ICD-10-CM | POA: Insufficient documentation

## 2020-09-11 DIAGNOSIS — E785 Hyperlipidemia, unspecified: Secondary | ICD-10-CM | POA: Insufficient documentation

## 2020-09-11 MED ORDER — VITAMIN D (ERGOCALCIFEROL) 1.25 MG (50000 UNIT) PO CAPS: 50000.0000 [IU] | ORAL_CAPSULE | ORAL | 0 refills | Status: AC

## 2020-09-12 ENCOUNTER — Other Ambulatory Visit: Payer: Self-pay | Admitting: Internal Medicine

## 2020-09-12 ENCOUNTER — Telehealth: Payer: Self-pay | Admitting: Internal Medicine

## 2020-09-12 DIAGNOSIS — E559 Vitamin D deficiency, unspecified: Secondary | ICD-10-CM

## 2020-09-12 DIAGNOSIS — Z0000 Encounter for general adult medical examination without abnormal findings: Secondary | ICD-10-CM

## 2020-09-12 NOTE — Telephone Encounter (Signed)
See result note.  

## 2020-09-12 NOTE — Telephone Encounter (Signed)
Pt was returning a call back for lab results

## 2020-09-26 ENCOUNTER — Ambulatory Visit: Payer: Managed Care, Other (non HMO)

## 2020-10-10 ENCOUNTER — Ambulatory Visit
Admission: RE | Admit: 2020-10-10 | Discharge: 2020-10-10 | Disposition: A | Discharge status: Home / Self Care | Payer: Managed Care, Other (non HMO) | Source: Ambulatory Visit | Attending: Internal Medicine | Admitting: Internal Medicine

## 2020-10-10 ENCOUNTER — Other Ambulatory Visit: Payer: Self-pay

## 2020-10-10 DIAGNOSIS — Z0000 Encounter for general adult medical examination without abnormal findings: Secondary | ICD-10-CM

## 2020-10-19 ENCOUNTER — Encounter: Payer: Self-pay | Admitting: Gastroenterology

## 2020-11-06 ENCOUNTER — Ambulatory Visit: Payer: Managed Care, Other (non HMO) | Admitting: Internal Medicine

## 2020-11-14 ENCOUNTER — Telehealth: Payer: Self-pay | Admitting: *Deleted

## 2020-11-14 NOTE — Telephone Encounter (Signed)
Missed pre-visit today @ 2:30.  Left message asking patient to reschedule PV  by 5pm today to avoid cancellation of up-coming colonoscopy 11/29/20.

## 2020-11-28 ENCOUNTER — Ambulatory Visit: Payer: Managed Care, Other (non HMO) | Admitting: Internal Medicine

## 2020-11-28 DIAGNOSIS — Z0289 Encounter for other administrative examinations: Secondary | ICD-10-CM

## 2020-11-29 ENCOUNTER — Encounter: Payer: Managed Care, Other (non HMO) | Admitting: Gastroenterology

## 2022-09-08 ENCOUNTER — Emergency Department (HOSPITAL_COMMUNITY): Payer: Managed Care, Other (non HMO)

## 2022-09-08 ENCOUNTER — Emergency Department (HOSPITAL_COMMUNITY)
Admission: EM | Admit: 2022-09-08 | Discharge: 2022-09-08 | Disposition: A | Discharge status: Home / Self Care | Payer: Managed Care, Other (non HMO) | Attending: Emergency Medicine | Admitting: Emergency Medicine

## 2022-09-08 ENCOUNTER — Encounter (HOSPITAL_COMMUNITY): Payer: Self-pay | Admitting: Emergency Medicine

## 2022-09-08 ENCOUNTER — Other Ambulatory Visit: Payer: Self-pay

## 2022-09-08 DIAGNOSIS — R112 Nausea with vomiting, unspecified: Secondary | ICD-10-CM | POA: Insufficient documentation

## 2022-09-08 DIAGNOSIS — R519 Headache, unspecified: Secondary | ICD-10-CM | POA: Insufficient documentation

## 2022-09-08 DIAGNOSIS — F17200 Nicotine dependence, unspecified, uncomplicated: Secondary | ICD-10-CM | POA: Diagnosis not present

## 2022-09-08 DIAGNOSIS — Z20822 Contact with and (suspected) exposure to covid-19: Secondary | ICD-10-CM | POA: Insufficient documentation

## 2022-09-08 DIAGNOSIS — R030 Elevated blood-pressure reading, without diagnosis of hypertension: Secondary | ICD-10-CM | POA: Diagnosis not present

## 2022-09-08 DIAGNOSIS — R0789 Other chest pain: Secondary | ICD-10-CM | POA: Insufficient documentation

## 2022-09-08 LAB — CBC WITH DIFFERENTIAL/PLATELET
Abs Immature Granulocytes: 0.01 10*3/uL (ref 0.00–0.07)
Basophils Absolute: 0 10*3/uL (ref 0.0–0.1)
Basophils Relative: 0 %
Eosinophils Absolute: 0.1 10*3/uL (ref 0.0–0.5)
Eosinophils Relative: 1 %
HCT: 40.6 % (ref 36.0–46.0)
Hemoglobin: 13.7 g/dL (ref 12.0–15.0)
Immature Granulocytes: 0 %
Lymphocytes Relative: 43 %
Lymphs Abs: 3 10*3/uL (ref 0.7–4.0)
MCH: 29.1 pg (ref 26.0–34.0)
MCHC: 33.7 g/dL (ref 30.0–36.0)
MCV: 86.2 fL (ref 80.0–100.0)
Monocytes Absolute: 0.4 10*3/uL (ref 0.1–1.0)
Monocytes Relative: 5 %
Neutro Abs: 3.5 10*3/uL (ref 1.7–7.7)
Neutrophils Relative %: 51 %
Platelets: 288 10*3/uL (ref 150–400)
RBC: 4.71 MIL/uL (ref 3.87–5.11)
RDW: 12 % (ref 11.5–15.5)
WBC: 6.9 10*3/uL (ref 4.0–10.5)
nRBC: 0 % (ref 0.0–0.2)

## 2022-09-08 LAB — BASIC METABOLIC PANEL
Anion gap: 14 (ref 5–15)
BUN: 13 mg/dL (ref 6–20)
CO2: 22 mmol/L (ref 22–32)
Calcium: 9.5 mg/dL (ref 8.9–10.3)
Chloride: 103 mmol/L (ref 98–111)
Creatinine, Ser: 0.66 mg/dL (ref 0.44–1.00)
GFR, Estimated: >60 mL/min (ref >60)
Glucose, Bld: 130 mg/dL — ABNORMAL HIGH (ref 70–99)
Potassium: 3.3 mmol/L — ABNORMAL LOW (ref 3.5–5.1)
Sodium: 139 mmol/L (ref 135–145)

## 2022-09-08 LAB — RESP PANEL BY RT-PCR (FLU A&B, COVID) ARPGX2
Influenza A by PCR: NEGATIVE
Influenza B by PCR: NEGATIVE
SARS Coronavirus 2 by RT PCR: NEGATIVE

## 2022-09-08 LAB — TROPONIN I (HIGH SENSITIVITY)
Troponin I (High Sensitivity): 8 ng/L (ref <18)
Troponin I (High Sensitivity): 7 ng/L (ref <18)

## 2022-09-08 MED ORDER — DEXAMETHASONE SODIUM PHOSPHATE 10 MG/ML IJ SOLN
10.0000 mg | Freq: Once | INTRAMUSCULAR | Status: AC
  Administered 2022-09-08: 10 mg via INTRAVENOUS
  Filled 2022-09-08: qty 1

## 2022-09-08 MED ORDER — KETOROLAC TROMETHAMINE 15 MG/ML IJ SOLN
15.0000 mg | Freq: Once | INTRAMUSCULAR | Status: AC
Start: 2022-09-08 — End: 2022-09-08
  Administered 2022-09-08: 15 mg via INTRAVENOUS
  Filled 2022-09-08: qty 1

## 2022-09-08 MED ORDER — SODIUM CHLORIDE 0.9 % IV BOLUS
1000.0000 mL | Freq: Once | INTRAVENOUS | Status: AC
  Administered 2022-09-08: 1000 mL via INTRAVENOUS

## 2022-09-08 NOTE — ED Notes (Signed)
Asked pt to do second blood draw for troponin, pt stated she did not want to be stuck again

## 2022-09-08 NOTE — ED Provider Notes (Signed)
[facility] EMERGENCY DEPARTMENT Provider Note   CSN: [id number] Arrival date & time: 09/08/22  0211     History  Chief Complaint  Patient presents with   Headache   Chest Pain    Nataliee Jeryl Wilbourn is a 51 y.o. female.  51 year old female with past medical history of migraines presents with complaint of feeling poorly.  Patient states that about 3 days ago she noticed minor headaches which progressed into more painful headaches and were associated with nausea and vomiting.  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  Patient went to work but continued to feel poorly, had her blood pressure checked at work which was elevated on several occasions.  She noted that she had some cloudy vision and requested to be brought to the ER via EMS.  Patient reports ongoing left mid axillary lower chest discomfort with headache, photophobia.  Exposed to another supervisor at work who has COVID. Patient took Tylenol for her headache without improvement.  Denies changes in speech, gait, unilateral weakness or numbness.  No history of hypertension. No significant family history.  Patient is an occasional smoker, occasional alcohol use.  No history of hypertension, hyperlipidemia, diabetes.       Home Medications Prior to Admission medications   Not on File      Allergies    Patient has no known allergies.    Review of Systems   Review of Systems Negative except as per HPI Physical Exam Updated Vital Signs BP (!) 136/92   Pulse 76   Temp 97.9 F (36.6 C) (Oral)   Resp 12   Ht 5\' 4"  (1.626 m)   Wt 79.8 kg   SpO2 99%   BMI 30.21 kg/m  Physical Exam Vitals and nursing note reviewed.  Constitutional:      General: She is not in  acute distress.    Appearance: She is well-developed. She is not diaphoretic.  HENT:     Head: Normocephalic and atraumatic.     Mouth/Throat:     Mouth: Mucous membranes are moist.  Eyes:     Extraocular Movements: Extraocular movements intact.     Pupils: Pupils are equal, round, and reactive to light.  Cardiovascular:     Rate and Rhythm: Normal rate and regular rhythm.     Heart sounds: Normal heart sounds. No murmur heard. Pulmonary:     Effort: Pulmonary effort is normal.     Breath sounds: Normal breath sounds.  Chest:     Chest wall: No tenderness.  Abdominal:     Palpations: Abdomen is soft.     Tenderness: There is no abdominal tenderness.  Musculoskeletal:        General: No tenderness.     Cervical back: Neck supple.     Right lower leg: No tenderness. No edema.     Left lower leg: No tenderness. No edema.  Skin:    General: Skin is warm and dry.     Findings: No erythema or rash.  Neurological:     Mental Status: She is alert and oriented to person, place, and time.     GCS: GCS eye subscore is 4. GCS verbal subscore is 5. GCS motor subscore is 6.  Cranial Nerves: No cranial nerve deficit or facial asymmetry.     Sensory: No sensory deficit.     Motor: No weakness.     Deep Tendon Reflexes: Reflexes normal. Babinski sign absent on the right side. Babinski sign absent on the left side.  Psychiatric:        Behavior: Behavior normal.     ED Results / Procedures / Treatments   Labs (all labs ordered are listed, but only abnormal results are displayed) Labs Reviewed  BASIC METABOLIC PANEL - Abnormal; Notable for the following components:      Result Value   Potassium 3.3 (*)    Glucose, Bld 130 (*)    All other components within normal limits  RESP PANEL BY RT-PCR (FLU A&B, COVID) ARPGX2  CBC WITH DIFFERENTIAL/PLATELET  TROPONIN I (HIGH SENSITIVITY)  TROPONIN I (HIGH SENSITIVITY)    EKG EKG Interpretation  Date/Time:  Monday September 08 2022  02:32:59 EDT Ventricular Rate:  87 PR Interval:  146 QRS Duration: 80 QT Interval:  374 QTC Calculation: 450 R Axis:   45 Text Interpretation: Normal sinus rhythm Nonspecific T wave abnormality Abnormal ECG No previous ECGs available Confirmed by Ernie Avena ([id number]) on 09/08/2022 9:09:15 AM  Radiology CT Head Wo Contrast  Result Date: 09/08/2022 CLINICAL DATA:  Headache EXAM: CT HEAD WITHOUT CONTRAST TECHNIQUE: Contiguous axial images were obtained from the base of the skull through the vertex without intravenous contrast. RADIATION DOSE REDUCTION: This exam was performed according to the departmental dose-optimization program which includes automated exposure control, adjustment of the mA and/or kV according to patient size and/or use of iterative reconstruction technique. COMPARISON:  None Available. FINDINGS: Brain: No evidence of acute infarction, hemorrhage, hydrocephalus, extra-axial collection or mass lesion/mass effect. Vascular: No hyperdense vessel or unexpected calcification. Skull: No fracture or focal lesion. Sinuses/Orbits: Right maxillary and left sphenoid mucous retention cysts. Normal orbits Other: None. IMPRESSION: No acute intracranial abnormality. Electronically Signed   By: Agustin Cree M.D.   On: 09/08/2022 12:16   DG Chest 2 View  Result Date: 09/08/2022 CLINICAL DATA:  Chest pain, hypertension EXAM: CHEST - 2 VIEW COMPARISON:  None Available. FINDINGS: Cardiac and mediastinal contours are within normal limits. No focal pulmonary opacity. No pleural effusion or pneumothorax. No acute osseous abnormality. IMPRESSION: No acute cardiopulmonary process. Electronically Signed   By: Wiliam Ke M.D.   On: 09/08/2022 02:58    Procedures Procedures    Medications Ordered in ED Medications  ketorolac (TORADOL) 15 MG/ML injection 15 mg (has no administration in time range)  dexamethasone (DECADRON) injection 10 mg (has no administration in time range)  sodium chloride 0.9 %  bolus 1,000 mL (0 mLs Intravenous Stopped 09/08/22 1100)    ED Course/ Medical Decision Making/ A&P                           Medical Decision Making Amount and/or Complexity of Data Reviewed Radiology: ordered.   This patient presents to the ED for concern of headache, left-sided chest discomfort, this involves an extensive number of treatment options, and is a complaint that carries with it a high risk of complications and morbidity.  The differential diagnosis includes but not limited to COVID/flu, viral illness, ACS, hypertensive urgency, brain mass   Co morbidities that complicate the patient evaluation  Migraines   Additional history obtained:  Additional history obtained from patient's mother at bedside who contributes to history as above External records from  outside source obtained and reviewed including visit to PCP for annual visit on 09/07/2020   Lab Tests:  I Ordered, and personally interpreted labs.  The pertinent results include:  CBC WNL, covid/flu negative, BMP with K 3.3 otherwise unremarkable. Troponin 8 and 7.    Imaging Studies ordered:  I ordered imaging studies including CXR, head CT  I independently visualized and interpreted imaging which showed no acute cardiopulmonary disease, head CT without acute findings. I agree with the radiologist interpretation   Cardiac Monitoring: / EKG:  The patient was maintained on a cardiac monitor.  I personally viewed and interpreted the cardiac monitored which showed an underlying rhythm of: sinus rhythm, rate 87   Problem List / ED Course / Critical interventions / Medication management  51 year old female with history of migraines presents with complaint of left side chest discomfort, headache, body aches.  Neuro exam is unremarkable.  Left lower mid axillary chest pain is not reproduced with palpation.  Work-up including labs, CT, chest x-ray and EKG are without significant findings.  Patient is provided with  medications for her headache.  Recommend follow-up with primary care provider for recheck, return to ER for worsening or concerning symptoms.  Discussed patient's slightly elevated blood pressure today.  Advised monitor and follow-up with PCP. I ordered medication including IVF, Toradol, Decadron for headache  Reevaluation of the patient after these medicines showed that the patient stayed the same (Toradol and Decadron at time of discharge). I have reviewed the patients home medicines and have made adjustments as needed   Social Determinants of Health:  Has PCP, works as a Librarian, academic.   Test / Admission - Considered:  Work-up complete without significant findings.  Recommend follow-up with PCP for recheck.  Provided ER return precautions.         Final Clinical Impression(s) / ED Diagnoses Final diagnoses:  Atypical chest pain  Acute nonintractable headache, unspecified headache type    Rx / DC Orders ED Discharge Orders     None         Tacy Learn, PA-C 09/08/22 1243    Regan Lemming, MD 09/08/22 1317

## 2022-09-08 NOTE — ED Triage Notes (Signed)
Pt states she has been feeling poorly all day.  She has had headache and high blood pressure at work.  Pt c/o of blurry vision, left sided chest pain and left arm pain.

## 2022-09-08 NOTE — ED Notes (Signed)
Pt denies CP at this time. Reports headache since yesterday.

## 2022-09-08 NOTE — Discharge Instructions (Signed)
Home to rest.  Recheck with your primary care provider.  Return to the emergency room for worsening or concerning symptoms.

## 2022-09-08 NOTE — ED Notes (Signed)
Patient transported to CT 

## 2022-09-08 NOTE — ED Provider Triage Note (Signed)
Emergency Medicine Provider Triage Evaluation Note  Sara Salinas , a 51 y.o. female  was evaluated in triage.  Pt complains of headache, HTN, chest pain.  States feeling poorly all day, went to work as usual and saw RN there who checked BP and noted to have several elevated readings. No hx of HTN, not on any meds for this.  No cardiac hx.  Denies cough, congestion.  Supervisor at work recently had covid.  Review of Systems  Positive: Chest pain, headache, HTN Negative: fever  Physical Exam  BP (!) 146/104 (BP Location: Right Arm)   Pulse 91   Temp 98.4 F (36.9 C) (Oral)   Resp 18   Ht 5\' 4"  (1.626 m)   Wt 79.8 kg   SpO2 98%   BMI 30.21 kg/m   Gen:   Awake, no distress   Resp:  Normal effort  MSK:   Moves extremities without difficulty  Other:    Medical Decision Making  Medically screening exam initiated at 2:24 AM.  Appropriate orders placed.  Sara Salinas was informed that the remainder of the evaluation will be completed by another provider, this initial triage assessment does not replace that evaluation, and the importance of remaining in the ED until their evaluation is complete.  Chest pain, headache, elevated BP readings at work.  No hx of HTN.  EKG, labs, CXR.  Will order covid screen given recent exposure.   Corky Sox, PA-C 09/08/22 [phone]

## 2022-12-29 ENCOUNTER — Ambulatory Visit (HOSPITAL_COMMUNITY)
Admission: EM | Admit: 2022-12-29 | Discharge: 2022-12-29 | Disposition: A | Discharge status: Home / Self Care | Payer: Managed Care, Other (non HMO) | Attending: Nurse Practitioner | Admitting: Nurse Practitioner

## 2022-12-29 ENCOUNTER — Encounter (HOSPITAL_COMMUNITY): Payer: Self-pay | Admitting: Nurse Practitioner

## 2022-12-29 DIAGNOSIS — R059 Cough, unspecified: Secondary | ICD-10-CM | POA: Diagnosis not present

## 2022-12-29 DIAGNOSIS — Z79899 Other long term (current) drug therapy: Secondary | ICD-10-CM | POA: Insufficient documentation

## 2022-12-29 DIAGNOSIS — J069 Acute upper respiratory infection, unspecified: Secondary | ICD-10-CM

## 2022-12-29 DIAGNOSIS — R051 Acute cough: Secondary | ICD-10-CM | POA: Diagnosis not present

## 2022-12-29 DIAGNOSIS — Z1152 Encounter for screening for COVID-19: Secondary | ICD-10-CM | POA: Insufficient documentation

## 2022-12-29 LAB — POC INFLUENZA A AND B ANTIGEN (URGENT CARE ONLY)
INFLUENZA A ANTIGEN, POC: NEGATIVE
INFLUENZA B ANTIGEN, POC: NEGATIVE

## 2022-12-29 MED ORDER — BENZONATATE 200 MG PO CAPS: 200.0000 mg | ORAL_CAPSULE | Freq: Three times a day (TID) | ORAL | 0 refills | Status: DC | PRN

## 2022-12-29 NOTE — ED Triage Notes (Signed)
Here for cough and congestion x 3 days. Pt reports is making it hard to sleep at night. Pt is taking Thera-flu with no relief.

## 2022-12-29 NOTE — Discharge Instructions (Addendum)
Your symptoms and exam are consistent for a viral illness. Please treat your symptoms with over the counter tylenol or ibuprofen, humidifier, and rest.  Tessalon as needed for cough.  Viral illnesses can last 7-14 days. Please follow up with your PCP if your symptoms are not improving. Please go to the ER for any worsening symptoms. This includes but is not limited to fever you can not control with tylenol or ibuprofen, you are not able to stay hydrated, you have shortness of breath or chest pain.  Thank you for choosing [facility] for your healthcare needs. I hope you feel better soon!  

## 2022-12-29 NOTE — ED Provider Notes (Signed)
[facility]    CSN: [id number] Arrival date & time: 12/29/22  1756      History   Chief Complaint Chief Complaint  Patient presents with   Cough    HPI Sara Salinas is a 52 y.o. female who presents for evaluation of URI symptoms for 3 days. Patient reports associated symptoms of cough, congestion, sore throat, chills. Denies N/V/D, fevers, ear pain, body aches, shortness of breath. Patient does not have a hx of asthma or smoking.  Reports sick contacts via family members.  no recent travel. Pt is  vaccinated for COVID. Pt is  vaccinated for flu this season. Pt has taken TheraFlu OTC for symptoms. Pt has no other concerns at this time.    Cough Associated symptoms: chills and sore throat     History reviewed. No pertinent past medical history.  Patient Active Problem List   Diagnosis Date Noted   Vitamin D deficiency 09/11/2020   IGT (impaired glucose tolerance) 09/11/2020   Hyperlipidemia 09/11/2020    Past Surgical History:  Procedure Laterality Date   ABDOMINAL HYSTERECTOMY     right rotator cuff     2020    OB History   No obstetric history on file.      Home Medications    Prior to Admission medications   Medication Sig Start Date End Date Taking? Authorizing Provider  benzonatate (TESSALON) 200 MG capsule Take 1 capsule (200 mg total) by mouth 3 (three) times daily as needed for cough. 12/29/22  Yes Melynda Ripple, NP    Family History Family History  Problem Relation Age of Onset   Hyperlipidemia Mother    Hypertension Mother    CAD Mother    Heart disease Maternal Grandmother    Diabetes Maternal Grandmother    Hypertension Maternal Grandmother     Social History Social History   Tobacco Use   Smoking status: Never   Smokeless tobacco: Never  Substance Use Topics   Alcohol use: Yes    Comment: occasional   Drug use: No     Allergies   Patient has no known allergies.   Review of Systems Review of Systems   Constitutional:  Positive for chills.  HENT:  Positive for congestion and sore throat.   Respiratory:  Positive for cough.      Physical Exam Triage Vital Signs ED Triage Vitals  Enc Vitals Group     BP 12/29/22 1918 (!) 146/95     Pulse Rate 12/29/22 1918 70     Resp 12/29/22 1918 16     Temp 12/29/22 1918 99.7 F (37.6 C)     Temp Source 12/29/22 1918 Oral     SpO2 12/29/22 1918 100 %     Weight --      Height --      Head Circumference --      Peak Flow --      Pain Score 12/29/22 1922 6     Pain Loc --      Pain Edu? --      Excl. in [place]? --    No data found.  Updated Vital Signs BP (!) 146/95 (BP Location: Left Arm)   Pulse 70   Temp 99.7 F (37.6 C) (Oral)   Resp 16   SpO2 100%   Visual Acuity Right Eye Distance:   Left Eye Distance:   Bilateral Distance:    Right Eye Near:   Left Eye Near:    Bilateral Near:  Physical Exam Vitals and nursing note reviewed.  Constitutional:      General: She is not in acute distress.    Appearance: She is well-developed. She is not ill-appearing.  HENT:     Head: Normocephalic and atraumatic.     Right Ear: Tympanic membrane and ear canal normal.     Left Ear: Tympanic membrane and ear canal normal.     Nose: Congestion present.     Mouth/Throat:     Mouth: Mucous membranes are moist.     Pharynx: Oropharynx is clear. Uvula midline. Posterior oropharyngeal erythema present.     Tonsils: No tonsillar exudate or tonsillar abscesses.  Eyes:     Conjunctiva/sclera: Conjunctivae normal.     Pupils: Pupils are equal, round, and reactive to light.  Cardiovascular:     Rate and Rhythm: Normal rate and regular rhythm.     Heart sounds: Normal heart sounds.  Pulmonary:     Effort: Pulmonary effort is normal.     Breath sounds: Normal breath sounds.  Musculoskeletal:     Cervical back: Normal range of motion and neck supple.  Lymphadenopathy:     Cervical: No cervical adenopathy.  Skin:    General: Skin is warm  and dry.  Neurological:     General: No focal deficit present.     Mental Status: She is alert and oriented to person, place, and time.  Psychiatric:        Mood and Affect: Mood normal.        Behavior: Behavior normal.      UC Treatments / Results  Labs (all labs ordered are listed, but only abnormal results are displayed) Labs Reviewed  SARS CORONAVIRUS 2 (TAT 6-24 HRS)  POC INFLUENZA A AND B ANTIGEN (URGENT CARE ONLY)    EKG   Radiology No results found.  Procedures Procedures (including critical care time)  Medications Ordered in UC Medications - No data to display  Initial Impression / Assessment and Plan / UC Course  I have reviewed the triage vital signs and the nursing notes.  Pertinent labs & imaging results that were available during my care of the patient were reviewed by me and considered in my medical decision making (see chart for details).    Exam and symptoms with patient.  No red flags on exam. Negative rapid flu COVID PCR and will contact if results positive.  If she is positive she is eligible for Paxlovid.  Denies any known kidney or liver function issues and does not take any daily medications prescribed or over-the-counter. Tessalon as needed cough Rest and fluids PCP follow-up 2 to 3 days for recheck ER precautions reviewed and she verbalized understanding Final Clinical Impressions(s) / UC Diagnoses   Final diagnoses:  Acute cough  Viral upper respiratory tract infection     Discharge Instructions      Your symptoms and exam are consistent for a viral illness. Please treat your symptoms with over the counter tylenol or ibuprofen, humidifier, and rest.  Tessalon as needed for cough. Viral illnesses can last 7-14 days. Please follow up with your PCP if your symptoms are not improving. Please go to the ER for any worsening symptoms. This includes but is not limited to fever you can not control with tylenol or ibuprofen, you are not able to  stay hydrated, you have shortness of breath or chest pain.  Thank you for choosing [facility] for your healthcare needs. I hope you feel better soon!  ED Prescriptions     Medication Sig Dispense Auth. Provider   benzonatate (TESSALON) 200 MG capsule Take 1 capsule (200 mg total) by mouth 3 (three) times daily as needed for cough. 20 capsule Melynda Ripple, NP      PDMP not reviewed this encounter.   Melynda Ripple, NP 12/29/22 2011

## 2022-12-30 LAB — SARS CORONAVIRUS 2 (TAT 6-24 HRS): SARS Coronavirus 2: NEGATIVE

## 2023-11-10 ENCOUNTER — Other Ambulatory Visit: Payer: Self-pay

## 2023-11-10 ENCOUNTER — Ambulatory Visit (HOSPITAL_COMMUNITY)
Admission: EM | Admit: 2023-11-10 | Discharge: 2023-11-10 | Disposition: A | Discharge status: Home / Self Care | Payer: Managed Care, Other (non HMO) | Attending: Emergency Medicine | Admitting: Emergency Medicine

## 2023-11-10 ENCOUNTER — Encounter (HOSPITAL_COMMUNITY): Payer: Self-pay | Admitting: Emergency Medicine

## 2023-11-10 DIAGNOSIS — R519 Headache, unspecified: Secondary | ICD-10-CM

## 2023-11-10 DIAGNOSIS — R030 Elevated blood-pressure reading, without diagnosis of hypertension: Secondary | ICD-10-CM | POA: Diagnosis not present

## 2023-11-10 MED ORDER — SUMATRIPTAN SUCCINATE 6 MG/0.5ML ~~LOC~~ SOLN
SUBCUTANEOUS | Status: AC
  Filled 2023-11-10: qty 0.5

## 2023-11-10 MED ORDER — SUMATRIPTAN SUCCINATE 6 MG/0.5ML ~~LOC~~ SOLN
6.0000 mg | Freq: Once | SUBCUTANEOUS | Status: AC
  Administered 2023-11-10: 6 mg via SUBCUTANEOUS

## 2023-11-10 MED ORDER — DEXAMETHASONE SODIUM PHOSPHATE 10 MG/ML IJ SOLN
10.0000 mg | Freq: Once | INTRAMUSCULAR | Status: AC
  Administered 2023-11-10: 10 mg via INTRAMUSCULAR

## 2023-11-10 MED ORDER — KETOROLAC TROMETHAMINE 30 MG/ML IJ SOLN
30.0000 mg | Freq: Once | INTRAMUSCULAR | Status: AC
  Administered 2023-11-10: 30 mg via INTRAMUSCULAR

## 2023-11-10 MED ORDER — DEXAMETHASONE SODIUM PHOSPHATE 10 MG/ML IJ SOLN
INTRAMUSCULAR | Status: AC
  Filled 2023-11-10: qty 1

## 2023-11-10 MED ORDER — KETOROLAC TROMETHAMINE 30 MG/ML IJ SOLN
INTRAMUSCULAR | Status: AC
  Filled 2023-11-10: qty 1

## 2023-11-10 NOTE — Discharge Instructions (Addendum)
The injections given for headache should continue to work for several hours. Please do not use any NSAIDs (ibuprofen/Advil, naproxen/Aleve, etc) for the next 12 hours. You can safely use tylenol.   Please keep appointment with primary care for Friday Please go to the emergency department if symptoms worsen.

## 2023-11-10 NOTE — ED Provider Notes (Signed)
[facility]    CSN: [id number] Arrival date & time: 11/10/23  1239     History   Chief Complaint Chief Complaint  Patient presents with   Headache   Hypertension    HPI Sara Salinas is a 52 y.o. female.  Woke with right sided frontal headache this morning Rating 10/10 throbbing pain, feels like her migraines. Not the worse headache of her life. She does not take migraine med. No medications taken today. No head injury. Not having dizziness or vision changes No weakness in extremities Denies nausea/vomiting. Has not had fever  No history of HTN. Reports nurse at work took her BP and systolic was 190s  History reviewed. No pertinent past medical history.  Patient Active Problem List   Diagnosis Date Noted   Vitamin D deficiency 09/11/2020   IGT (impaired glucose tolerance) 09/11/2020   Hyperlipidemia 09/11/2020    Past Surgical History:  Procedure Laterality Date   ABDOMINAL HYSTERECTOMY     right rotator cuff     2020    OB History   No obstetric history on file.      Home Medications    Prior to Admission medications   Medication Sig Start Date End Date Taking? Authorizing Provider  benzonatate (TESSALON) 200 MG capsule Take 1 capsule (200 mg total) by mouth 3 (three) times daily as needed for cough. 12/29/22   Radford Pax, NP    Family History Family History  Problem Relation Age of Onset   Hyperlipidemia Mother    Hypertension Mother    CAD Mother    Heart disease Maternal Grandmother    Diabetes Maternal Grandmother    Hypertension Maternal Grandmother     Social History Social History   Tobacco Use   Smoking status: Never   Smokeless tobacco: Never  Substance Use Topics   Alcohol use: Yes    Comment: occasional   Drug use: No     Allergies   Patient has no known allergies.   Review of Systems Review of Systems  Neurological:  Positive for headaches.   Per HPI  Physical Exam Triage Vital Signs ED Triage  Vitals [11/10/23 1335]  Encounter Vitals Group     BP (!) 174/103     Systolic BP Percentile      Diastolic BP Percentile      Pulse Rate 85     Resp 16     Temp 98 F (36.7 C)     Temp Source Oral     SpO2 99 %     Weight      Height      Head Circumference      Peak Flow      Pain Score 10     Pain Loc      Pain Education      Exclude from Growth Chart    No data found.  Updated Vital Signs BP (!) 168/104 (BP Location: Left Arm)   Pulse 85   Temp 98 F (36.7 C) (Oral)   Resp 16   SpO2 99%    Physical Exam Vitals and nursing note reviewed.  Constitutional:      General: She is not in acute distress.    Appearance: She is not ill-appearing.  HENT:     Head: Atraumatic.     Mouth/Throat:     Mouth: Mucous membranes are moist.     Pharynx: Oropharynx is clear.  Eyes:     Extraocular Movements: Extraocular movements  intact.     Conjunctiva/sclera: Conjunctivae normal.     Pupils: Pupils are equal, round, and reactive to light.  Cardiovascular:     Rate and Rhythm: Normal rate and regular rhythm.     Heart sounds: Normal heart sounds.  Pulmonary:     Effort: Pulmonary effort is normal.     Breath sounds: Normal breath sounds.  Musculoskeletal:        General: Normal range of motion.     Cervical back: Normal range of motion. No rigidity or tenderness.  Skin:    General: Skin is warm and dry.  Neurological:     General: No focal deficit present.     Mental Status: She is alert and oriented to person, place, and time.     Cranial Nerves: No cranial nerve deficit.     Sensory: No sensory deficit.     Motor: No weakness.     Coordination: Coordination normal.     Gait: Gait normal.     Comments: Strength and sensation intact     UC Treatments / Results  Labs (all labs ordered are listed, but only abnormal results are displayed) Labs Reviewed - No data to display  EKG  Radiology No results found.  Procedures Procedures  Medications Ordered in  UC Medications  ketorolac (TORADOL) 30 MG/ML injection 30 mg (30 mg Intramuscular Given 11/10/23 1405)  dexamethasone (DECADRON) injection 10 mg (10 mg Intramuscular Given 11/10/23 1406)  SUMAtriptan (IMITREX) injection 6 mg (6 mg Subcutaneous Given 11/10/23 1406)    Initial Impression / Assessment and Plan / UC Course  I have reviewed the triage vital signs and the nursing notes.  Pertinent labs & imaging results that were available during my care of the patient were reviewed by me and considered in my medical decision making (see chart for details).  She is neurologically intact Headache is 10/10 but reported to feel like her migraines in the past.  IM toradol, decadron, imitrex given. Pain is down to 4/10, patient feeling much improved. No red flags at this time. Advised strict ED precautions for any change or worsening   BP is elevated in clinic however highest systolic 174. Improved on recheck. Likely related to pain/headache. No hx HTN, usually has stable BP's at visits. While in clinic today patient made an appointment with her PCP for 3 days from now on 11/15. Will closely follow regarding symptoms and elevated BP  Final Clinical Impressions(s) / UC Diagnoses   Final diagnoses:  Bad headache  Elevated BP without diagnosis of hypertension     Discharge Instructions      The injections given for headache should continue to work for several hours. Please do not use any NSAIDs (ibuprofen/Advil, naproxen/Aleve, etc) for the next 12 hours. You can safely use tylenol.   Please keep appointment with primary care for Friday Please go to the emergency department if symptoms worsen.    ED Prescriptions   None    PDMP not reviewed this encounter.   Marlow Baars, [place] 11/10/23 1510

## 2023-11-10 NOTE — ED Triage Notes (Signed)
Pt c/o severe HA started today was send to UC from her work due to having HTN.

## 2023-11-12 ENCOUNTER — Other Ambulatory Visit: Payer: Self-pay

## 2023-11-12 ENCOUNTER — Emergency Department (HOSPITAL_COMMUNITY): Payer: Managed Care, Other (non HMO)

## 2023-11-12 ENCOUNTER — Encounter (HOSPITAL_COMMUNITY): Payer: Self-pay | Admitting: Emergency Medicine

## 2023-11-12 ENCOUNTER — Emergency Department (HOSPITAL_COMMUNITY)
Admission: EM | Admit: 2023-11-12 | Discharge: 2023-11-12 | Disposition: A | Discharge status: Home / Self Care | Payer: Managed Care, Other (non HMO) | Attending: Emergency Medicine | Admitting: Emergency Medicine

## 2023-11-12 DIAGNOSIS — D72829 Elevated white blood cell count, unspecified: Secondary | ICD-10-CM | POA: Insufficient documentation

## 2023-11-12 DIAGNOSIS — E876 Hypokalemia: Secondary | ICD-10-CM | POA: Diagnosis not present

## 2023-11-12 DIAGNOSIS — R519 Headache, unspecified: Secondary | ICD-10-CM | POA: Insufficient documentation

## 2023-11-12 LAB — COMPREHENSIVE METABOLIC PANEL
ALT: 21 U/L (ref 0–44)
AST: 18 U/L (ref 15–41)
Albumin: 3.6 g/dL (ref 3.5–5.0)
Alkaline Phosphatase: 73 U/L (ref 38–126)
Anion gap: 9 (ref 5–15)
BUN: 23 mg/dL — ABNORMAL HIGH (ref 6–20)
CO2: 23 mmol/L (ref 22–32)
Calcium: 8.8 mg/dL — ABNORMAL LOW (ref 8.9–10.3)
Chloride: 104 mmol/L (ref 98–111)
Creatinine, Ser: 0.91 mg/dL (ref 0.44–1.00)
GFR, Estimated: >60 mL/min (ref >60)
Glucose, Bld: 118 mg/dL — ABNORMAL HIGH (ref 70–99)
Potassium: 3.2 mmol/L — ABNORMAL LOW (ref 3.5–5.1)
Sodium: 136 mmol/L (ref 135–145)
Total Bilirubin: 0.9 mg/dL (ref <1.2)
Total Protein: 7.1 g/dL (ref 6.5–8.1)

## 2023-11-12 LAB — CBC
HCT: 36.8 % (ref 36.0–46.0)
Hemoglobin: 12.1 g/dL (ref 12.0–15.0)
MCH: 28.3 pg (ref 26.0–34.0)
MCHC: 32.9 g/dL (ref 30.0–36.0)
MCV: 86.2 fL (ref 80.0–100.0)
Platelets: 293 10*3/uL (ref 150–400)
RBC: 4.27 MIL/uL (ref 3.87–5.11)
RDW: 13 % (ref 11.5–15.5)
WBC: 11.1 10*3/uL — ABNORMAL HIGH (ref 4.0–10.5)
nRBC: 0 % (ref 0.0–0.2)

## 2023-11-12 MED ORDER — PROCHLORPERAZINE MALEATE 5 MG PO TABS
10.0000 mg | ORAL_TABLET | Freq: Once | ORAL | Status: AC
  Administered 2023-11-12: 10 mg via ORAL
  Filled 2023-11-12: qty 2

## 2023-11-12 MED ORDER — KETOROLAC TROMETHAMINE 15 MG/ML IJ SOLN
15.0000 mg | Freq: Once | INTRAMUSCULAR | Status: AC
Start: 2023-11-12 — End: 2023-11-12
  Administered 2023-11-12: 15 mg via INTRAMUSCULAR
  Filled 2023-11-12: qty 1

## 2023-11-12 MED ORDER — POTASSIUM CHLORIDE CRYS ER 20 MEQ PO TBCR: 20.0000 meq | EXTENDED_RELEASE_TABLET | Freq: Two times a day (BID) | ORAL | 0 refills | Status: AC

## 2023-11-12 MED ORDER — POTASSIUM CHLORIDE CRYS ER 20 MEQ PO TBCR
40.0000 meq | EXTENDED_RELEASE_TABLET | Freq: Once | ORAL | Status: AC
  Administered 2023-11-12: 40 meq via ORAL
  Filled 2023-11-12: qty 2

## 2023-11-12 MED ORDER — DIPHENHYDRAMINE HCL 25 MG PO CAPS
25.0000 mg | ORAL_CAPSULE | Freq: Once | ORAL | Status: AC
  Administered 2023-11-12: 25 mg via ORAL
  Filled 2023-11-12: qty 1

## 2023-11-12 NOTE — ED Provider Notes (Signed)
[place] EMERGENCY DEPARTMENT AT [facility] Provider Note   CSN: [id number] Arrival date & time: 11/12/23  1022     History  Chief Complaint  Patient presents with   Headache    Sara Salinas is a 52 y.o. female history of migraines presented for a headache that is been present for the past week.  Patient initially noticed the headache at work and the work nurse said that her blood pressure was systolic 190 and to go to the ER.  Patient stated at the time she had mild blurry vision out of both eyes but denied any jaw symptoms.  Patient states that she gets migraines chronically and that this headache is on the right side and feels very similar to previous migraines.  Patient was evaluated at urgent care and was given Toradol along with Decadron and sumatriptan and improved and was ultimately discharged and does have a primary care provider appointment tomorrow for blood pressure management.  Patient is not on any prophylactic migraine medications.  Patient states that the headache is coming back but it is not sudden and maximal at onset.  Patient denies any vision changes this time around, neck pain, new onset weakness, gait changes, abdominal pain, chest pain, shortness of breath, fevers.  Patient has not been taking any medication at home as she was unsure if she could have Tylenol or not.  Home Medications Prior to Admission medications   Medication Sig Start Date End Date Taking? Authorizing Provider  potassium chloride SA (KLOR-CON M) 20 MEQ tablet Take 1 tablet (20 mEq total) by mouth 2 (two) times daily for 3 days. 11/12/23 11/15/23 Yes Netta Corrigan, PA-C      Allergies    Patient has no known allergies.    Review of Systems   Review of Systems  Neurological:  Positive for headaches.    Physical Exam Updated Vital Signs BP (!) 138/91   Pulse 85   Temp 99.2 F (37.3 C) (Oral)   Resp 16   SpO2 97%  Physical Exam Constitutional:      Appearance: She  is normal weight.  HENT:     Head: Normocephalic.     Comments: No temporal artery tenderness No jaw claudication Eyes:     Extraocular Movements: Extraocular movements intact.     Conjunctiva/sclera: Conjunctivae normal.     Pupils: Pupils are equal, round, and reactive to light.  Cardiovascular:     Rate and Rhythm: Normal rate and regular rhythm.     Pulses: Normal pulses.     Heart sounds: Normal heart sounds.  Pulmonary:     Effort: Pulmonary effort is normal.     Breath sounds: Normal breath sounds.  Abdominal:     Palpations: Abdomen is soft.     Tenderness: There is no abdominal tenderness. There is no guarding or rebound.  Musculoskeletal:        General: Normal range of motion.     Cervical back: Normal range of motion. No rigidity or tenderness.  Skin:    General: Skin is warm and dry.     Capillary Refill: Capillary refill takes less than 2 seconds.  Neurological:     General: No focal deficit present.     Mental Status: She is alert.     Sensory: Sensation is intact.     Motor: Motor function is intact.     Coordination: Coordination is intact.     Gait: Gait is intact.  Comments: Cranial nerves III through XII intact Vision grossly intact Sensation tact in all 4 extremities  Psychiatric:        Mood and Affect: Mood normal.     ED Results / Procedures / Treatments   Labs (all labs ordered are listed, but only abnormal results are displayed) Labs Reviewed  COMPREHENSIVE METABOLIC PANEL - Abnormal; Notable for the following components:      Result Value   Potassium 3.2 (*)    Glucose, Bld 118 (*)    BUN 23 (*)    Calcium 8.8 (*)    All other components within normal limits  CBC - Abnormal; Notable for the following components:   WBC 11.1 (*)    All other components within normal limits    EKG None  Radiology CT Head Wo Contrast  Result Date: 11/12/2023 CLINICAL DATA:  Headache. EXAM: CT HEAD WITHOUT CONTRAST TECHNIQUE: Contiguous axial  images were obtained from the base of the skull through the vertex without intravenous contrast. RADIATION DOSE REDUCTION: This exam was performed according to the departmental dose-optimization program which includes automated exposure control, adjustment of the mA and/or kV according to patient size and/or use of iterative reconstruction technique. COMPARISON:  None Available. FINDINGS: Brain: No evidence of acute infarction, hemorrhage, hydrocephalus, extra-axial collection or mass lesion/mass effect. Vascular: No hyperdense vessel or unexpected calcification. Skull: No acute fracture. Sinuses/Orbits: Clear sinuses.  No acute orbital findings. Other: No mastoid effusions IMPRESSION: No evidence of acute intracranial abnormality. Electronically Signed   By: Feliberto Harts M.D.   On: 11/12/2023 14:25    Procedures Procedures    Medications Ordered in ED Medications  potassium chloride SA (KLOR-CON M) CR tablet 40 mEq (40 mEq Oral Given 11/12/23 1241)  diphenhydrAMINE (BENADRYL) capsule 25 mg (25 mg Oral Given 11/12/23 1241)  prochlorperazine (COMPAZINE) tablet 10 mg (10 mg Oral Given 11/12/23 1241)  ketorolac (TORADOL) 15 MG/ML injection 15 mg (15 mg Intramuscular Given 11/12/23 1419)    ED Course/ Medical Decision Making/ A&P                                 Medical Decision Making Amount and/or Complexity of Data Reviewed Labs: ordered. Radiology: ordered.  Risk Prescription drug management.   Sara Salinas 52 y.o. presented today for headache. Working DDx that I considered at this time includes, but not limited to, tension headache, migraine, intracranial mass, intracranial hemorrhage, intracranial infection including meningitis vs encephalitis, GCA, trigeminal neuralgia, AVM, sinusitis, cerebral aneurysm, muscular headache, cavernous sinus thrombosis, carotid artery dissection.  R/o DDx: intracranial mass, intracranial hemorrhage, intracranial infection including meningitis  vs encephalitis, GCA, trigeminal neuralgia, AVM, sinusitis, cerebral aneurysm, muscular headache, cavernous sinus thrombosis, carotid artery dissection:  less likely due to history of present illness, physical exam, labs/imaging findings  Review of prior external notes: 11/10/2023 ED  Unique Tests and My Interpretation:  CBC: Mild leukocytosis 11.1 CMP: Hypokalemia 3.2 CT Head w/o Contrast: Unremarkable  Social Determinants of Health: none  Discussion with Independent Historian: None  Discussion of Management of Tests: None  Risk: Medium: prescription drug management  Risk Stratification Score: None  Plan: On exam patient was in no acute distress with stable vitals. Physical exam was unremarkable including a neurologic exam. Presentation is like pts typical HA and non concerning for [facility], ICH, Meningitis, temporal arteritis, or other life-threatening headaches. Pt is afebrile with no focal neuro deficits, nuchal rigidity, or change in  vision. Labs and imaging will be ordered. Patient will be given Compazine and Benadryl for HA treatment. Patient stable at this time.  Patient's blood pressure today in the ED is 138/91 and so very low suspicion patient's having intracranial hemorrhaging from her blood pressure or that her blood pressure is contributing to her new headache.  Do suspect patient is having a migraine as she states this is very similar to what she has experienced in the past along with having reassuring physical exam and concerning history.  If discharged will have her follow-up with her primary care provider tomorrow if she does already have an appointment to be placed on prophylactic migraine medications.  Patient does have mild leukocytosis however she did receive a shot of Decadron a few days ago which would explain the leukocytosis as she does not endorse any infectious symptoms.  Potassium was slightly low and will be replenished and given a few days worth.  On recheck patient  was doing well and CT was negative.  At this time patient is been in the ER for 4-1/2 hours and has remained vitally stable and has not shown any red flag features with this headache.  I encouraged patient to follow-up with her primary care provider tomorrow as she already has an appointment.  Will give patient a few days where the potassium pills.  Encourage patient use Tylenol every 6 hours as needed for pain and to rest the next few days.  Given patient's description of the headache today along with her blood pressure being 138/91 I do suspect possibly the stress of the events that have occurred earlier with her high blood pressure and headache may have triggered a migraine as her headache today is one-sided and did improve with medications and does feel similar to previous migraines.  At this time a very low suspicion of any life-threatening diagnoses.  On recheck patient stated that she was feeling slightly better from the meds but was worried if she get more pain meds for the headache.  Patient's kidney function is good and she has had a hysterectomy and have a very low suspicion of any intracranial bleeding at this time so we will give 1 dose of Toradol she had this before and this seemed to help.  Patient headache treated and improved while in ED. Patient is to follow up with PCP to discuss prophylactic medication.   Patient was given return precautions. Patient stable for discharge at this time.  Patient verbalized understanding of plan.  This chart was dictated using voice recognition software.  Despite best efforts to proofread,  errors can occur which can change the documentation meaning.         Final Clinical Impression(s) / ED Diagnoses Final diagnoses:  Hypokalemia  Bad headache    Rx / DC Orders ED Discharge Orders          Ordered    potassium chloride SA (KLOR-CON M) 20 MEQ tablet  2 times daily        11/12/23 1441              Remi Deter 11/12/23  1447    Gwyneth Sprout, MD 11/18/23 1535

## 2023-11-12 NOTE — ED Triage Notes (Signed)
Pt reports headache and HTN. Pt states she was seen 2 days ago for headache and was told if headache and BP was worse to go to the ER. Pt reports she is not on BP medication.

## 2023-11-12 NOTE — ED Notes (Signed)
Pt d/c home per EDP order. Discharge summary reviewed with pt, pt verbalizes understand. NAD.

## 2023-11-12 NOTE — Discharge Instructions (Signed)
Please follow-up with your primary care in regards to recent ER visit.  Today your physical exam and CT of your head were all reassuring you most likely have a migraine causing your symptoms You speak with your primary care provider about prophylactic migraine medication.  Please speak with him as well about blood pressure meds as well.  Today your blood pressure was normal but since you do have history of high blood pressure recently it would be worthwhile to speak with your provider about this.  I have given you a few days with potassium pills your potassium is slightly low as well.  You may take Tylenol every 6 hours needed for pain.  If symptoms change or worsen please return to ER.

## 2023-11-17 ENCOUNTER — Encounter (HOSPITAL_COMMUNITY): Payer: Self-pay

## 2023-11-17 ENCOUNTER — Ambulatory Visit (HOSPITAL_COMMUNITY)
Admission: EM | Admit: 2023-11-17 | Discharge: 2023-11-17 | Disposition: A | Discharge status: Home / Self Care | Payer: Managed Care, Other (non HMO) | Attending: Nurse Practitioner | Admitting: Nurse Practitioner

## 2023-11-17 DIAGNOSIS — J029 Acute pharyngitis, unspecified: Secondary | ICD-10-CM | POA: Diagnosis not present

## 2023-11-17 LAB — POCT RAPID STREP A (OFFICE): Rapid Strep A Screen: NEGATIVE

## 2023-11-17 MED ORDER — DEXAMETHASONE SODIUM PHOSPHATE 10 MG/ML IJ SOLN
10.0000 mg | Freq: Once | INTRAMUSCULAR | Status: AC
  Administered 2023-11-17: 10 mg

## 2023-11-17 MED ORDER — SUMATRIPTAN SUCCINATE 6 MG/0.5ML ~~LOC~~ SOLN
6.0000 mg | Freq: Once | SUBCUTANEOUS | Status: AC
  Administered 2023-11-17: 6 mg via SUBCUTANEOUS

## 2023-11-17 MED ORDER — SUMATRIPTAN SUCCINATE 6 MG/0.5ML ~~LOC~~ SOLN
SUBCUTANEOUS | Status: AC
  Filled 2023-11-17: qty 0.5

## 2023-11-17 MED ORDER — DEXAMETHASONE SODIUM PHOSPHATE 10 MG/ML IJ SOLN
INTRAMUSCULAR | Status: AC
  Filled 2023-11-17: qty 1

## 2023-11-17 NOTE — Discharge Instructions (Addendum)
Your Strep Test is negative. Culture is pending. We encourage conservative treatment with symptom relief. We encourage you to use Tylenol for your pain (Remember to use as directed do not exceed daily dosing recommendations). We also encourage salt water gargles for your sore throat. You should also consider throat lozenges and chloraseptic spray.

## 2023-11-17 NOTE — ED Provider Notes (Addendum)
[facility]    CSN: [id number] Arrival date & time: 11/17/23  1028      History   Chief Complaint Chief Complaint  Patient presents with   Headache   Sore Throat    HPI Sara Salinas is a 52 y.o. female.   HPI  She is in today for evaluation of her sore throat.  She reports that she has had 5 days of sore throats leaving the emergency department.  She is having some difficulty swallowing.  She denies fever but he has experienced some chills.  She denies history of strep throat.  She denies any other family members being sick.  She does work full-time in the lab. She has been diagnosed with elevated blood pressure reports that she has appointment with her primary care provider on Thursday.  She she is experiencing dizziness.  She feels like this is going on since the sore throat.  She denies any chest pain, shortness of breath.  She reports that she does continue to have a monitor blood pressure questionable change in diet. History reviewed. No pertinent past medical history.  Patient Active Problem List   Diagnosis Date Noted   Vitamin D deficiency 09/11/2020   IGT (impaired glucose tolerance) 09/11/2020   Hyperlipidemia 09/11/2020    Past Surgical History:  Procedure Laterality Date   ABDOMINAL HYSTERECTOMY     right rotator cuff     2020    OB History   No obstetric history on file.      Home Medications    Prior to Admission medications   Medication Sig Start Date End Date Taking? Authorizing Provider  potassium chloride SA (KLOR-CON M) 20 MEQ tablet Take 1 tablet (20 mEq total) by mouth 2 (two) times daily for 3 days. 11/12/23 11/15/23  Netta Corrigan, PA-C    Family History Family History  Problem Relation Age of Onset   Hyperlipidemia Mother    Hypertension Mother    CAD Mother    Heart disease Maternal Grandmother    Diabetes Maternal Grandmother    Hypertension Maternal Grandmother     Social History Social History   Tobacco  Use   Smoking status: Never   Smokeless tobacco: Never  Substance Use Topics   Alcohol use: Yes    Comment: occasional   Drug use: No     Allergies   Patient has no known allergies.   Review of Systems Review of Systems   Physical Exam Triage Vital Signs ED Triage Vitals  Encounter Vitals Group     BP 11/17/23 1033 (!) 141/95     Systolic BP Percentile --      Diastolic BP Percentile --      Pulse Rate 11/17/23 1033 (!) 109     Resp 11/17/23 1033 16     Temp 11/17/23 1033 98.7 F (37.1 C)     Temp Source 11/17/23 1033 Oral     SpO2 11/17/23 1033 97 %     Weight 11/17/23 1034 190 lb (86.2 kg)     Height 11/17/23 1034 5\' 4"  (1.626 m)     Head Circumference --      Peak Flow --      Pain Score 11/17/23 1034 8     Pain Loc --      Pain Education --      Exclude from Growth Chart --    No data found.  Updated Vital Signs BP (!) 141/95 (BP Location: Right Arm)  Pulse (!) 109   Temp 98.7 F (37.1 C) (Oral)   Resp 16   Ht 5\' 4"  (1.626 m)   Wt 190 lb (86.2 kg)   SpO2 97%   BMI 32.61 kg/m   Visual Acuity Right Eye Distance:   Left Eye Distance:   Bilateral Distance:    Right Eye Near:   Left Eye Near:    Bilateral Near:     Physical Exam HENT:     Head: Normocephalic and atraumatic.     Mouth/Throat:     Mouth: Mucous membranes are moist.     Pharynx: Posterior oropharyngeal erythema present.     Tonsils: No tonsillar exudate. 1+ on the right. 1+ on the left.  Eyes:     Extraocular Movements: Extraocular movements intact.  Cardiovascular:     Rate and Rhythm: Normal rate and regular rhythm.  Pulmonary:     Effort: Pulmonary effort is normal.     Breath sounds: Normal breath sounds.  Abdominal:     Palpations: Abdomen is soft.  Neurological:     Mental Status: She is alert.     GCS: GCS eye subscore is 4. GCS verbal subscore is 5. GCS motor subscore is 6.      UC Treatments / Results  Labs (all labs ordered are listed, but only abnormal  results are displayed) Labs Reviewed  CULTURE, GROUP A STREP [facility])  POCT RAPID STREP A (OFFICE)    EKG   Radiology No results found.  Procedures Procedures (including critical care time)  Medications Ordered in UC Medications - No data to display  Initial Impression / Assessment and Plan / UC Course  I have reviewed the triage vital signs and the nursing notes.  Pertinent labs & imaging results that were available during my care of the patient were reviewed by me and considered in my medical decision making (see chart for details).     Sore throat Persistent headache related to elevated sugar and blood pressure Final Clinical Impressions(s) / UC Diagnoses   Final diagnoses:  Sore throat     Discharge Instructions      Your Strep Test is negative. Culture is pending. We encourage conservative treatment with symptom relief. We encourage you to use Tylenol for your pain (Remember to use as directed do not exceed daily dosing recommendations). We also encourage salt water gargles for your sore throat. You should also consider throat lozenges and chloraseptic spray.      At discharge patient had additional questions after we had discussed her treatment .  States she is having headaches and dizziness.  She is requesting additional treatment. She reports that she has been informed that her headaches have nothing to do with her blood pressure.  She was treated for migraine with Ketorolac. and Decadron and Imitrex on 11/10/23. At the emergency room she was given Benadryl Compazine and ketorolac 11/12/23.  She was treated with Imitrex and Decadron.  She was informed that she should not be using NSAIDs because this can raise her blood pressure.  She agreed with the treatment.  Ms. Endris is encouraged to follow-up with her primary care on Thursday for further evaluation.   ED Prescriptions   None    PDMP not reviewed this encounter.   Barbette Merino, NP 11/17/23  1158    Barbette Merino, NP 11/17/23 1245

## 2023-11-17 NOTE — ED Triage Notes (Signed)
Patient here today with concerns of her blood pressure. She checked it this morning and it read 167/107. Patient states that she she started having dizziness today. She has also been having a headache since last week. She was seen here last week for the same symptoms. After her visit here, she went to the ED due to increased pain with her headache.   She is also c/o ST X 4-5 days after leaving the ED. Patient states that she had chills last night.

## 2023-11-19 LAB — CULTURE, GROUP A STREP (THRC)

## 2024-01-04 ENCOUNTER — Encounter (HOSPITAL_BASED_OUTPATIENT_CLINIC_OR_DEPARTMENT_OTHER): Payer: Self-pay

## 2024-01-04 DIAGNOSIS — R5383 Other fatigue: Secondary | ICD-10-CM

## 2024-01-28 ENCOUNTER — Ambulatory Visit: Payer: Managed Care, Other (non HMO) | Admitting: Dietician

## 2024-07-17 ENCOUNTER — Ambulatory Visit (HOSPITAL_COMMUNITY): Admission: EM | Admit: 2024-07-17 | Discharge: 2024-07-17 | Disposition: A | Discharge status: Home / Self Care

## 2024-07-17 ENCOUNTER — Encounter (HOSPITAL_COMMUNITY): Payer: Self-pay | Admitting: Emergency Medicine

## 2024-07-17 DIAGNOSIS — R0981 Nasal congestion: Secondary | ICD-10-CM | POA: Diagnosis not present

## 2024-07-17 HISTORY — DX: Essential (primary) hypertension: I10

## 2024-07-17 HISTORY — DX: Pure hypercholesterolemia, unspecified: E78.00

## 2024-07-17 MED ORDER — PREDNISONE 20 MG PO TABS: 40.0000 mg | ORAL_TABLET | Freq: Every day | ORAL | 0 refills | Status: AC

## 2024-07-17 MED ORDER — AZELASTINE HCL 0.1 % NA SOLN: 1.0000 | Freq: Two times a day (BID) | NASAL | 1 refills | Status: AC

## 2024-07-17 NOTE — Discharge Instructions (Signed)
  1. Nasal sinus congestion (Primary) - predniSONE  (DELTASONE ) 20 MG tablet; Take 2 tablets (40 mg total) by mouth daily for 5 days.  Dispense: 10 tablet; Refill: 0 - azelastine  (ASTELIN ) 0.1 % nasal spray; Place 1 spray into both nostrils 2 (two) times daily. Use in each nostril as directed  Dispense: 30 mL; Refill: 1 - Continue to monitor symptoms for any change in severity if there is any escalation of recurrent symptoms or development of new symptoms return to urgent care or go to the ER for further evaluation and management.

## 2024-07-17 NOTE — ED Triage Notes (Signed)
 Pt reports 2 days ago having congestion, facial pain. Pt reports Friday and Saturday throat was itching but has stopped. Took tylneol and theraflu.

## 2024-07-17 NOTE — ED Provider Notes (Signed)
 [facility] [place]  Note:  This document was prepared using Dragon voice recognition software and may include unintentional dictation errors.  MRN: [id number] DOB: 24-Sep-1971  Subjective:   Sara Salinas is a 53 y.o. female presenting for nasal congestion, mild maxillary facial pain, itching throat x 2 days.  Patient reports that throat pain has improved significantly but still having facial pain and nasal congestion.  Patient reports that she cannot breathe at all out of her right nostril while trying to sleep.  Patient has tried Tylenol  and TheraFlu with minimal improvement.  Patient reports past history of sinus congestion and pressure in the right side.  Patient was concerned for possible sinus infection.  No current facility-administered medications for this encounter.  Current Outpatient Medications:    atorvastatin (LIPITOR) 10 MG tablet, Take 10 mg by mouth daily., Disp: , Rfl:    azelastine  (ASTELIN ) 0.1 % nasal spray, Place 1 spray into both nostrils 2 (two) times daily. Use in each nostril as directed, Disp: 30 mL, Rfl: 1   predniSONE  (DELTASONE ) 20 MG tablet, Take 2 tablets (40 mg total) by mouth daily for 5 days., Disp: 10 tablet, Rfl: 0   propranolol (INDERAL) 40 MG tablet, Take 1 tablet every day by oral route for 90 days., Disp: , Rfl:    hydrochlorothiazide (HYDRODIURIL) 12.5 MG tablet, Take 12.5 mg by mouth daily., Disp: , Rfl:    metFORMIN (GLUCOPHAGE) 500 MG tablet, Take 500 mg by mouth 2 (two) times daily., Disp: , Rfl:    OZEMPIC, 0.25 OR 0.5 MG/DOSE, 2 MG/3ML SOPN, INJECT 0.25 MG SUBCUTANEOUS EVERY WEEK, Disp: , Rfl:    potassium chloride  SA (KLOR-CON  M) 20 MEQ tablet, Take 1 tablet (20 mEq total) by mouth 2 (two) times daily for 3 days., Disp: 6 tablet, Rfl: 0   traZODone (DESYREL) 50 MG tablet, Take 25 mg by mouth daily., Disp: , Rfl:    No Known Allergies  Past Medical History:  Diagnosis Date   High cholesterol    Hypertension      Past Surgical  History:  Procedure Laterality Date   ABDOMINAL HYSTERECTOMY     right rotator cuff     2020    Family History  Problem Relation Age of Onset   Hyperlipidemia Mother    Hypertension Mother    CAD Mother    Heart disease Maternal Grandmother    Diabetes Maternal Grandmother    Hypertension Maternal Grandmother     Social History   Tobacco Use   Smoking status: Never   Smokeless tobacco: Never  Substance Use Topics   Alcohol use: Yes    Comment: occasional   Drug use: No    ROS Refer to HPI for ROS details.  Objective:   Vitals: BP 130/84 (BP Location: Left Arm)   Pulse (!) 103   Temp 99.3 F (37.4 C) (Oral)   Resp 17   SpO2 96%   Physical Exam Vitals and nursing note reviewed.  Constitutional:      General: She is not in acute distress.    Appearance: Normal appearance. She is well-developed. She is not ill-appearing or toxic-appearing.  HENT:     Head: Normocephalic and atraumatic.     Nose: Nasal tenderness, mucosal edema and congestion present. No rhinorrhea.     Right Turbinates: Swollen.     Left Turbinates: Swollen.     Right Sinus: Maxillary sinus tenderness present. No frontal sinus tenderness.     Left Sinus: No maxillary sinus  tenderness or frontal sinus tenderness.  Cardiovascular:     Rate and Rhythm: Normal rate.  Pulmonary:     Effort: Pulmonary effort is normal. No respiratory distress.  Musculoskeletal:        General: Normal range of motion.  Skin:    General: Skin is warm and dry.  Neurological:     General: No focal deficit present.     Mental Status: She is alert and oriented to person, place, and time.  Psychiatric:        Mood and Affect: Mood normal.        Behavior: Behavior normal.     Procedures  No results found for this or any previous visit (from the past 24 hours).  No results found.   Assessment and Plan :     Discharge Instructions       1. Nasal sinus congestion (Primary) - predniSONE  (DELTASONE ) 20  MG tablet; Take 2 tablets (40 mg total) by mouth daily for 5 days.  Dispense: 10 tablet; Refill: 0 - azelastine  (ASTELIN ) 0.1 % nasal spray; Place 1 spray into both nostrils 2 (two) times daily. Use in each nostril as directed  Dispense: 30 mL; Refill: 1 - Continue to monitor symptoms for any change in severity if there is any escalation of recurrent symptoms or development of new symptoms return to urgent care or go to the ER for further evaluation and management.      Hubert Derstine B Nitara Szczerba   Dontario Evetts, [place] B, [place] 07/17/24 1534
